# Patient Record
Sex: Female | Born: 2000 | Race: Black or African American | Hispanic: No | Marital: Single
Health system: Southern US, Community
[De-identification: ages and names within clinical notes are randomized; demographics above are authoritative.]

## PROBLEM LIST (undated history)

## (undated) HISTORY — PX: FOOT SURGERY: SHX648

---
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
## 2021-01-11 DIAGNOSIS — Z419 Encounter for procedure for purposes other than remedying health state, unspecified: Secondary | ICD-10-CM | POA: Diagnosis not present

## 2021-02-11 DIAGNOSIS — Z419 Encounter for procedure for purposes other than remedying health state, unspecified: Secondary | ICD-10-CM | POA: Diagnosis not present

## 2021-03-14 DIAGNOSIS — Z419 Encounter for procedure for purposes other than remedying health state, unspecified: Secondary | ICD-10-CM | POA: Diagnosis not present

## 2021-04-13 DIAGNOSIS — Z419 Encounter for procedure for purposes other than remedying health state, unspecified: Secondary | ICD-10-CM | POA: Diagnosis not present

## 2021-05-12 ENCOUNTER — Other Ambulatory Visit: Payer: Self-pay

## 2021-05-12 ENCOUNTER — Ambulatory Visit (HOSPITAL_COMMUNITY)
Admission: EM | Admit: 2021-05-12 | Discharge: 2021-05-12 | Discharge status: Against Medical Advice | Payer: Medicaid Other

## 2021-05-14 DIAGNOSIS — Z419 Encounter for procedure for purposes other than remedying health state, unspecified: Secondary | ICD-10-CM | POA: Diagnosis not present

## 2021-06-13 DIAGNOSIS — Z419 Encounter for procedure for purposes other than remedying health state, unspecified: Secondary | ICD-10-CM | POA: Diagnosis not present

## 2021-06-30 ENCOUNTER — Ambulatory Visit (HOSPITAL_COMMUNITY)
Admission: EM | Admit: 2021-06-30 | Discharge: 2021-06-30 | Disposition: A | Discharge status: Home / Self Care | Payer: Medicaid Other | Attending: Family Medicine | Admitting: Family Medicine

## 2021-06-30 ENCOUNTER — Other Ambulatory Visit: Payer: Self-pay

## 2021-06-30 ENCOUNTER — Encounter (HOSPITAL_COMMUNITY): Payer: Self-pay | Admitting: Emergency Medicine

## 2021-06-30 DIAGNOSIS — N61 Mastitis without abscess: Secondary | ICD-10-CM

## 2021-06-30 DIAGNOSIS — N6001 Solitary cyst of right breast: Secondary | ICD-10-CM | POA: Diagnosis not present

## 2021-06-30 MED ORDER — CEPHALEXIN 250 MG/5ML PO SUSR
250.0000 mg | Freq: Three times a day (TID) | ORAL | 0 refills | Status: AC
Start: 2021-06-30 — End: 2021-07-07

## 2021-06-30 MED ORDER — CEPHALEXIN 250 MG PO CAPS
250.0000 mg | ORAL_CAPSULE | Freq: Three times a day (TID) | ORAL | 0 refills | Status: DC
Start: 2021-06-30 — End: 2021-06-30

## 2021-06-30 NOTE — ED Provider Notes (Signed)
[facility]    CSN: [id number] Arrival date & time: 06/30/21  1633      History   Chief Complaint Chief Complaint  Patient presents with   Breast Mass    HPI Jeanne Harrison is a 20 y.o. female.   HPI Here for knot in right breast for about 3 months, and has slowly enlarged. Is painful now. No f/c/n/v/d.  LMP doesn't have periods regularly.  History reviewed. No pertinent past medical history.  There are no problems to display for this patient.   Past Surgical History:  Procedure Laterality Date   FOOT SURGERY      OB History   No obstetric history on file.      Home Medications    Prior to Admission medications   Medication Sig Start Date End Date Taking? Authorizing Provider  cephALEXin (KEFLEX) 250 MG capsule Take 1 capsule (250 mg total) by mouth 3 (three) times daily for 7 days. 06/30/21 07/07/21 Yes Zenia Resides, MD  ondansetron (ZOFRAN-ODT) 4 MG disintegrating tablet Take 1 tablet (4 mg total) by mouth every 8 (eight) hours as needed for nausea or vomiting. 09/23/20   Georgetta Haber, NP  terconazole (TERAZOL 3) 0.8 % vaginal cream Place 1 applicator vaginally at bedtime. 09/11/20   Wallis Bamberg, PA-C  valACYclovir (VALTREX) 1000 MG tablet At the start of an outbreak, take 1 tablet daily for 5 days. 09/11/20   Wallis Bamberg, PA-C    Family History Family History  Family history unknown: Yes    Social History Social History   Tobacco Use   Smoking status: Passive Smoke Exposure - Never Smoker   Smokeless tobacco: Never  Substance Use Topics   Alcohol use: No   Drug use: No     Allergies   Patient has no known allergies.   Review of Systems Review of Systems   Physical Exam Triage Vital Signs ED Triage Vitals [06/30/21 1735]  Enc Vitals Group     BP (!) 153/89     Pulse Rate 86     Resp 19     Temp 98.6 F (37 C)     Temp Source Oral     SpO2 98 %     Weight      Height      Head Circumference      Peak Flow       Pain Score      Pain Loc      Pain Edu?      Excl. in GC?    No data found.  Updated Vital Signs BP (!) 153/89 (BP Location: Left Arm)    Pulse 86    Temp 98.6 F (37 C) (Oral)    Resp 19    SpO2 98%   Visual Acuity Right Eye Distance:   Left Eye Distance:   Bilateral Distance:    Right Eye Near:   Left Eye Near:    Bilateral Near:     Physical Exam Constitutional:      General: She is not in acute distress. Cardiovascular:     Rate and Rhythm: Normal rate and regular rhythm.     Heart sounds: No murmur heard. Pulmonary:     Effort: Pulmonary effort is normal.     Breath sounds: Normal breath sounds.  Chest:       Comments: Right medial breast has a fluctuant swelling that is not well defined, about 3 cm x 2 cm, mild erythema, and is  tender some. Skin:    Coloration: Skin is not jaundiced or pale.  Neurological:     General: No focal deficit present.     Mental Status: She is alert and oriented to person, place, and time.  Psychiatric:        Behavior: Behavior normal.     UC Treatments / Results  Labs (all labs ordered are listed, but only abnormal results are displayed) Labs Reviewed - No data to display  EKG   Radiology No results found.  Procedures Procedures (including critical care time)  Medications Ordered in UC Medications - No data to display  Initial Impression / Assessment and Plan / UC Course  I have reviewed the triage vital signs and the nursing notes.  Pertinent labs & imaging results that were available during my care of the patient were reviewed by me and considered in my medical decision making (see chart for details).     Prob cyst, with poss mastitis/cellulitis now. Discussed may need imaging/draining if not improving. Instructions given on finding a pcp Final Clinical Impressions(s) / UC Diagnoses   Final diagnoses:  Benign breast cyst in female, right  Acute mastitis of right breast     Discharge Instructions       Warm compresses. Take cephalexin 3 times daily for 7 days.  Come back if worsening or not improving.     ED Prescriptions     Medication Sig Dispense Auth. Provider   cephALEXin (KEFLEX) 250 MG capsule Take 1 capsule (250 mg total) by mouth 3 (three) times daily for 7 days. 21 capsule Barrett Henle, MD      PDMP not reviewed this encounter.   Barrett Henle, MD 06/30/21 [id number]

## 2021-06-30 NOTE — ED Triage Notes (Signed)
Pt c/o knot on right breast that been ongoing for about 3 months. Reports larger in size.

## 2021-06-30 NOTE — Discharge Instructions (Addendum)
Warm compresses. Take cephalexin 3 times daily for 7 days.  Come back if worsening or not improving.

## 2021-07-14 DIAGNOSIS — Z419 Encounter for procedure for purposes other than remedying health state, unspecified: Secondary | ICD-10-CM | POA: Diagnosis not present

## 2021-08-14 DIAGNOSIS — Z419 Encounter for procedure for purposes other than remedying health state, unspecified: Secondary | ICD-10-CM | POA: Diagnosis not present

## 2021-09-05 ENCOUNTER — Emergency Department (HOSPITAL_COMMUNITY)
Admission: EM | Admit: 2021-09-05 | Discharge: 2021-09-05 | Disposition: A | Discharge status: Home / Self Care | Payer: Medicaid Other | Attending: Emergency Medicine | Admitting: Emergency Medicine

## 2021-09-05 ENCOUNTER — Emergency Department (HOSPITAL_COMMUNITY): Payer: Medicaid Other

## 2021-09-05 ENCOUNTER — Other Ambulatory Visit: Payer: Self-pay

## 2021-09-05 ENCOUNTER — Encounter (HOSPITAL_COMMUNITY): Payer: Self-pay | Admitting: Emergency Medicine

## 2021-09-05 DIAGNOSIS — Y92410 Unspecified street and highway as the place of occurrence of the external cause: Secondary | ICD-10-CM | POA: Diagnosis not present

## 2021-09-05 DIAGNOSIS — M25522 Pain in left elbow: Secondary | ICD-10-CM | POA: Insufficient documentation

## 2021-09-05 DIAGNOSIS — M79651 Pain in right thigh: Secondary | ICD-10-CM | POA: Insufficient documentation

## 2021-09-05 DIAGNOSIS — M79652 Pain in left thigh: Secondary | ICD-10-CM | POA: Diagnosis not present

## 2021-09-05 DIAGNOSIS — M25561 Pain in right knee: Secondary | ICD-10-CM | POA: Diagnosis not present

## 2021-09-05 DIAGNOSIS — M25562 Pain in left knee: Secondary | ICD-10-CM | POA: Diagnosis not present
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
## 2021-09-05 NOTE — ED Notes (Signed)
Pt verbalized understanding of d/c instructions, meds, and followup care. Denies questions. VSS, no distress noted. Steady gait to exit with all belongings.  ?

## 2021-09-11 DIAGNOSIS — Z419 Encounter for procedure for purposes other than remedying health state, unspecified: Secondary | ICD-10-CM | POA: Diagnosis not present

## 2021-10-12 DIAGNOSIS — Z419 Encounter for procedure for purposes other than remedying health state, unspecified: Secondary | ICD-10-CM | POA: Diagnosis not present

## 2021-11-11 DIAGNOSIS — Z419 Encounter for procedure for purposes other than remedying health state, unspecified: Secondary | ICD-10-CM | POA: Diagnosis not present

## 2021-12-12 DIAGNOSIS — Z419 Encounter for procedure for purposes other than remedying health state, unspecified: Secondary | ICD-10-CM | POA: Diagnosis not present

## 2021-12-13 ENCOUNTER — Emergency Department (HOSPITAL_COMMUNITY)
Admission: EM | Admit: 2021-12-13 | Discharge: 2021-12-13 | Disposition: A | Discharge status: Home / Self Care | Payer: Medicaid Other | Attending: Emergency Medicine | Admitting: Emergency Medicine

## 2021-12-13 ENCOUNTER — Encounter (HOSPITAL_COMMUNITY): Payer: Self-pay | Admitting: Emergency Medicine

## 2021-12-13 ENCOUNTER — Other Ambulatory Visit: Payer: Self-pay

## 2021-12-13 ENCOUNTER — Emergency Department (HOSPITAL_COMMUNITY): Payer: Medicaid Other

## 2021-12-13 DIAGNOSIS — R1011 Right upper quadrant pain: Secondary | ICD-10-CM | POA: Diagnosis not present

## 2021-12-13 DIAGNOSIS — R112 Nausea with vomiting, unspecified: Secondary | ICD-10-CM | POA: Insufficient documentation

## 2021-12-13 DIAGNOSIS — R197 Diarrhea, unspecified: Secondary | ICD-10-CM | POA: Diagnosis not present

## 2021-12-13 DIAGNOSIS — R1010 Upper abdominal pain, unspecified: Secondary | ICD-10-CM

## 2021-12-13 LAB — CBC
HCT: 43.1 % (ref 36.0–46.0)
Hemoglobin: 14 g/dL (ref 12.0–15.0)
MCH: 26.6 pg (ref 26.0–34.0)
MCHC: 32.5 g/dL (ref 30.0–36.0)
MCV: 81.8 fL (ref 80.0–100.0)
Platelets: 310 10*3/uL (ref 150–400)
RBC: 5.27 MIL/uL — ABNORMAL HIGH (ref 3.87–5.11)
RDW: 13.6 % (ref 11.5–15.5)
WBC: 8.7 10*3/uL (ref 4.0–10.5)
nRBC: 0 % (ref 0.0–0.2)

## 2021-12-13 LAB — COMPREHENSIVE METABOLIC PANEL
ALT: 21 U/L (ref 0–44)
AST: 25 U/L (ref 15–41)
Albumin: 3.8 g/dL (ref 3.5–5.0)
Alkaline Phosphatase: 82 U/L (ref 38–126)
Anion gap: 8 (ref 5–15)
BUN: 9 mg/dL (ref 6–20)
CO2: 26 mmol/L (ref 22–32)
Calcium: 9.2 mg/dL (ref 8.9–10.3)
Chloride: 106 mmol/L (ref 98–111)
Creatinine, Ser: 0.92 mg/dL (ref 0.44–1.00)
GFR, Estimated: >60 mL/min (ref >60)
Glucose, Bld: 93 mg/dL (ref 70–99)
Potassium: 4.4 mmol/L (ref 3.5–5.1)
Sodium: 140 mmol/L (ref 135–145)
Total Bilirubin: 0.7 mg/dL (ref 0.3–1.2)
Total Protein: 7.9 g/dL (ref 6.5–8.1)

## 2021-12-13 LAB — I-STAT BETA HCG BLOOD, ED (MC, WL, AP ONLY): I-stat hCG, quantitative: <5 m[IU]/mL (ref <5)

## 2021-12-13 LAB — LIPASE, BLOOD: Lipase: 35 U/L (ref 11–51)

## 2021-12-13 MED ORDER — OMEPRAZOLE 20 MG PO CPDR: 20.0000 mg | DELAYED_RELEASE_CAPSULE | Freq: Every day | ORAL | 0 refills | Status: DC

## 2021-12-13 MED ORDER — MORPHINE SULFATE (PF) 4 MG/ML IV SOLN
4.0000 mg | Freq: Once | INTRAVENOUS | Status: AC
  Administered 2021-12-13: 4 mg via INTRAVENOUS
  Filled 2021-12-13: qty 1

## 2021-12-13 MED ORDER — ONDANSETRON HCL 4 MG/2ML IJ SOLN
4.0000 mg | Freq: Once | INTRAMUSCULAR | Status: AC
  Administered 2021-12-13: 4 mg via INTRAVENOUS
  Filled 2021-12-13: qty 2

## 2021-12-13 NOTE — ED Notes (Signed)
US at bedside

## 2021-12-13 NOTE — Discharge Instructions (Signed)
You are seen today for abdominal pain.  Your work-up is generally reassuring.  Follow-up with gastroenterology if symptoms continue or worsen.

## 2021-12-13 NOTE — ED Provider Notes (Signed)
[facility]   CSN: [id number] Arrival date & time: 12/13/21  0249     History  Chief Complaint  Patient presents with   Abdominal Pain    Jeanne Harrison is a 21 y.o. female.  HPI     This is a 21 year old female with no reported past medical history who presents with abdominal pain.  Patient reports 2-week history of worsening sharp right upper quadrant and right epigastric abdominal pain.  It is worse with eating.  She reports nausea and vomiting as well as occasional diarrhea.  No known sick contacts.  Denies fevers.  Has not taken anything for her pain.  No past surgical history.  Home Medications Prior to Admission medications   Medication Sig Start Date End Date Taking? Authorizing Provider  omeprazole (PRILOSEC) 20 MG capsule Take 1 capsule (20 mg total) by mouth daily. 12/13/21  Yes Kymoni Lesperance, Mayer Maskerourtney F, MD  ondansetron (ZOFRAN-ODT) 4 MG disintegrating tablet Take 1 tablet (4 mg total) by mouth every 8 (eight) hours as needed for nausea or vomiting. 09/23/20   Georgetta HaberBurky, Natalie B, NP  terconazole (TERAZOL 3) 0.8 % vaginal cream Place 1 applicator vaginally at bedtime. 09/11/20   Wallis BambergMani, Mario, PA-C  valACYclovir (VALTREX) 1000 MG tablet At the start of an outbreak, take 1 tablet daily for 5 days. 09/11/20   Wallis BambergMani, Mario, PA-C      Allergies    Patient has no known allergies.    Review of Systems   Review of Systems  Constitutional:  Negative for fever.  Gastrointestinal:  Positive for abdominal pain, nausea and vomiting.  All other systems reviewed and are negative.  Physical Exam Updated Vital Signs BP (!) 146/101   Pulse 94   Temp 98.7 F (37.1 C) (Oral)   Resp 16   SpO2 100%  Physical Exam Vitals and nursing note reviewed.  Constitutional:      Appearance: She is well-developed. She is obese. She is not ill-appearing.  HENT:     Head: Normocephalic and atraumatic.  Eyes:     Pupils: Pupils are equal, round, and  reactive to light.  Cardiovascular:     Rate and Rhythm: Normal rate and regular rhythm.     Heart sounds: Normal heart sounds.  Pulmonary:     Effort: Pulmonary effort is normal. No respiratory distress.     Breath sounds: No wheezing.  Abdominal:     General: Bowel sounds are normal.     Palpations: Abdomen is soft.     Tenderness: There is abdominal tenderness in the right upper quadrant. There is no guarding or rebound.  Musculoskeletal:     Cervical back: Neck supple.  Skin:    General: Skin is warm and dry.  Neurological:     Mental Status: She is alert and oriented to person, place, and time.  Psychiatric:        Mood and Affect: Mood normal.    ED Results / Procedures / Treatments   Labs (all labs ordered are listed, but only abnormal results are displayed) Labs Reviewed  CBC - Abnormal; Notable for the following components:      Result Value   RBC 5.27 (*)    All other components within normal limits  LIPASE, BLOOD  COMPREHENSIVE METABOLIC PANEL  URINALYSIS, ROUTINE W REFLEX MICROSCOPIC  I-STAT BETA HCG BLOOD, ED ([facility], [facility], [facility] ONLY)    EKG None  Radiology US Abdomen Limited RUQ (LIVER/GB)  Result Date: 12/13/2021  CLINICAL DATA:  Right upper quadrant pain. EXAM: ULTRASOUND ABDOMEN LIMITED RIGHT UPPER QUADRANT COMPARISON:  None Available. FINDINGS: Gallbladder: No gallstones or wall thickening visualized. No sonographic Murphy sign noted by sonographer. Common bile duct: Diameter: 3.6 mm with no visible intrahepatic biliary prominence. Liver: No focal lesion identified. The liver is diffusely attenuating and echogenic consistent with fatty replacement. Portal vein is patent on color Doppler imaging with normal direction of blood flow towards the liver. Other: None. IMPRESSION: 1. No evidence of cholelithiasis or acute cholecystitis. 2. Diffusely echogenic liver consistent with steatosis. Electronically Signed   By: Almira Bar M.D.   On: 12/13/2021 06:21     Procedures Procedures    Medications Ordered in ED Medications  morphine (PF) 4 MG/ML injection 4 mg (4 mg Intravenous Given 12/13/21 0542)  ondansetron (ZOFRAN) injection 4 mg (4 mg Intravenous Given 12/13/21 0542)    ED Course/ Medical Decision Making/ A&P                           Medical Decision Making Amount and/or Complexity of Data Reviewed Labs: ordered. Radiology: ordered.  Risk Prescription drug management.   This patient presents to the ED for concern of abdominal pain, this involves an extensive number of treatment options, and is a complaint that carries with it a high risk of complications and morbidity.  I considered the following differential and admission for this acute, potentially life threatening condition.  The differential diagnosis includes cholecystitis, pancreatitis, gastritis, gastroenteritis, less likely appendicitis  MDM:     This is a 21 year old female who presents with abdominal pain.  Reports 2-week history of intermittent postprandial pain.  She is nontoxic and vital signs are reassuring.  Labs reviewed.  No significant leukocytosis.  No LFT derangements or lipase derangements.  Right upper quadrant ultrasound imaging does not show any evidence of gallstones or gallbladder inflammation.  On recheck, patient states she is comfortable.  Given her age, could be related to reflux.  Will start on omeprazole.  (Labs, imaging, consults)  Labs: I Ordered, and personally interpreted labs.  The pertinent results include: CBC, CMP, lipase, beta-hCG  Imaging Studies ordered: I ordered imaging studies including a right upper quadrant ultrasound I independently visualized and interpreted imaging. I agree with the radiologist interpretation  Additional history obtained from chart review.  External records from outside source obtained and reviewed including prior visits  Cardiac Monitoring: The patient was maintained on a cardiac monitor.  I personally  viewed and interpreted the cardiac monitored which showed an underlying rhythm of: Normal sinus rhythm  Reevaluation: After the interventions noted above, I reevaluated the patient and found that they have :improved  Social Determinants of Health: Lives independently  Disposition: Discharge  Co morbidities that complicate the patient evaluation History reviewed. No pertinent past medical history.   Medicines Meds ordered this encounter  Medications   morphine (PF) 4 MG/ML injection 4 mg   ondansetron (ZOFRAN) injection 4 mg   omeprazole (PRILOSEC) 20 MG capsule    Sig: Take 1 capsule (20 mg total) by mouth daily.    Dispense:  30 capsule    Refill:  0    I have reviewed the patients home medicines and have made adjustments as needed  Problem List / ED Course: Problem List Items Addressed This Visit   None Visit Diagnoses     Pain of upper abdomen    -  Primary  Final Clinical Impression(s) / ED Diagnoses Final diagnoses:  Pain of upper abdomen    Rx / DC Orders ED Discharge Orders          Ordered    omeprazole (PRILOSEC) 20 MG capsule  Daily        12/13/21 0635              Shon Baton, MD 12/13/21 [phone]

## 2021-12-13 NOTE — ED Notes (Signed)
Patient verbalizes understanding of d/c instructions. Opportunities for questions and answers were provided. Pt d/c from ED and ambulated to lobby where family is picking pt up.  

## 2021-12-13 NOTE — ED Triage Notes (Signed)
Pt c/o generalized abdominal pain with nausea/vomiting "for a while". Reports symptoms do not occur everyday. Also c/o left ear pain.

## 2022-03-14 DIAGNOSIS — Z419 Encounter for procedure for purposes other than remedying health state, unspecified: Secondary | ICD-10-CM | POA: Diagnosis not present

## 2022-04-13 DIAGNOSIS — Z419 Encounter for procedure for purposes other than remedying health state, unspecified: Secondary | ICD-10-CM | POA: Diagnosis not present

## 2022-04-19 ENCOUNTER — Encounter (HOSPITAL_COMMUNITY): Payer: Self-pay | Admitting: Emergency Medicine

## 2022-04-19 ENCOUNTER — Emergency Department (HOSPITAL_COMMUNITY): Payer: Medicaid Other

## 2022-04-19 ENCOUNTER — Emergency Department (HOSPITAL_COMMUNITY)
Admission: EM | Admit: 2022-04-19 | Discharge: 2022-04-19 | Disposition: A | Discharge status: Home / Self Care | Payer: Medicaid Other | Attending: Emergency Medicine | Admitting: Emergency Medicine

## 2022-04-19 ENCOUNTER — Other Ambulatory Visit: Payer: Self-pay

## 2022-04-19 DIAGNOSIS — J40 Bronchitis, not specified as acute or chronic: Secondary | ICD-10-CM | POA: Diagnosis not present

## 2022-04-19 DIAGNOSIS — L0100 Impetigo, unspecified: Secondary | ICD-10-CM | POA: Insufficient documentation

## 2022-04-19 DIAGNOSIS — R079 Chest pain, unspecified: Secondary | ICD-10-CM | POA: Diagnosis not present

## 2022-04-19 DIAGNOSIS — R0789 Other chest pain: Secondary | ICD-10-CM

## 2022-04-19 DIAGNOSIS — R0602 Shortness of breath: Secondary | ICD-10-CM | POA: Diagnosis not present

## 2022-04-19 LAB — BASIC METABOLIC PANEL
Anion gap: 7 (ref 5–15)
BUN: 5 mg/dL — ABNORMAL LOW (ref 6–20)
CO2: 24 mmol/L (ref 22–32)
Calcium: 8.8 mg/dL — ABNORMAL LOW (ref 8.9–10.3)
Chloride: 106 mmol/L (ref 98–111)
Creatinine, Ser: 0.94 mg/dL (ref 0.44–1.00)
GFR, Estimated: >60 mL/min (ref >60)
Glucose, Bld: 90 mg/dL (ref 70–99)
Potassium: 4.5 mmol/L (ref 3.5–5.1)
Sodium: 137 mmol/L (ref 135–145)

## 2022-04-19 LAB — CBC
HCT: 42.8 % (ref 36.0–46.0)
Hemoglobin: 14 g/dL (ref 12.0–15.0)
MCH: 26.8 pg (ref 26.0–34.0)
MCHC: 32.7 g/dL (ref 30.0–36.0)
MCV: 82 fL (ref 80.0–100.0)
Platelets: 276 10*3/uL (ref 150–400)
RBC: 5.22 MIL/uL — ABNORMAL HIGH (ref 3.87–5.11)
RDW: 14.2 % (ref 11.5–15.5)
WBC: 7.4 10*3/uL (ref 4.0–10.5)
nRBC: 0 % (ref 0.0–0.2)

## 2022-04-19 LAB — I-STAT BETA HCG BLOOD, ED (MC, WL, AP ONLY): I-stat hCG, quantitative: <5 m[IU]/mL (ref <5)

## 2022-04-19 LAB — TROPONIN I (HIGH SENSITIVITY): Troponin I (High Sensitivity): <2 ng/L (ref <18)

## 2022-04-19 MED ORDER — ALBUTEROL SULFATE HFA 108 (90 BASE) MCG/ACT IN AERS: 2.0000 | INHALATION_SPRAY | RESPIRATORY_TRACT | 2 refills | Status: DC | PRN

## 2022-04-19 MED ORDER — PREDNISONE 20 MG PO TABS
60.0000 mg | ORAL_TABLET | Freq: Once | ORAL | Status: AC
  Administered 2022-04-19: 60 mg via ORAL
  Filled 2022-04-19: qty 3

## 2022-04-19 MED ORDER — DOXYCYCLINE HYCLATE 100 MG PO CAPS: 100.0000 mg | ORAL_CAPSULE | Freq: Two times a day (BID) | ORAL | 0 refills | Status: DC

## 2022-04-19 MED ORDER — ALBUTEROL SULFATE HFA 108 (90 BASE) MCG/ACT IN AERS
2.0000 | INHALATION_SPRAY | RESPIRATORY_TRACT | Status: DC | PRN
  Administered 2022-04-19: 2 via RESPIRATORY_TRACT
  Filled 2022-04-19: qty 6.7

## 2022-04-19 MED ORDER — FLUCONAZOLE 200 MG PO TABS: 200.0000 mg | ORAL_TABLET | Freq: Every day | ORAL | 0 refills | Status: DC
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
## 2022-04-19 NOTE — ED Provider Notes (Signed)
[facility] EMERGENCY DEPARTMENT Provider Note   CSN: [id number] Arrival date & time: 04/19/22  0019     History  Chief Complaint  Patient presents with   Chest Pain   Shortness of Breath    Jeanne Harrison is a 21 y.o. female.  Patient presents to the emergency department with multiple complaints.  Patient reports that she has been having intermittent episodes of sharp stabbing pain in the right upper chest for 3 weeks.  Patient reports that there is mold in her mother's home and it has been "getting everyone sick".  Patient reports that she has had a persistent cough over this period of time and coughing makes the pain worse.  There is also pain with movement of the chest wall.  Denies direct injury.  Patient does report some shortness of breath.  Patient also concerned about a rash on her face.  She reports that she thought it started as pimples but it is spreading.       Home Medications Prior to Admission medications   Medication Sig Start Date End Date Taking? Authorizing Provider  albuterol (VENTOLIN HFA) 108 (90 Base) MCG/ACT inhaler Inhale 2 puffs into the lungs every 4 (four) hours as needed for wheezing or shortness of breath. 04/19/22  Yes Jeffrey Graefe, Gwenyth Allegra, MD  doxycycline (VIBRAMYCIN) 100 MG capsule Take 1 capsule (100 mg total) by mouth 2 (two) times daily. 04/19/22  Yes Niaya Hickok, Gwenyth Allegra, MD  fluconazole (DIFLUCAN) 200 MG tablet Take 1 tablet (200 mg total) by mouth daily. 04/19/22  Yes Camar Guyton, Gwenyth Allegra, MD  omeprazole (PRILOSEC) 20 MG capsule Take 1 capsule (20 mg total) by mouth daily. 12/13/21   Horton, Barbette Hair, MD  ondansetron (ZOFRAN-ODT) 4 MG disintegrating tablet Take 1 tablet (4 mg total) by mouth every 8 (eight) hours as needed for nausea or vomiting. 09/23/20   Zigmund Gottron, NP  terconazole (TERAZOL 3) 0.8 % vaginal cream Place 1 applicator vaginally at bedtime. 09/11/20   Jaynee Eagles, PA-C  valACYclovir (VALTREX) 1000 MG  tablet At the start of an outbreak, take 1 tablet daily for 5 days. 09/11/20   Jaynee Eagles, PA-C      Allergies    Patient has no known allergies.    Review of Systems   Review of Systems  Physical Exam Updated Vital Signs BP (!) 129/92 (BP Location: Left Arm)   Pulse 83   Temp 98.6 F (37 C) (Oral)   Resp 14   SpO2 100%  Physical Exam Vitals and nursing note reviewed.  Constitutional:      General: She is not in acute distress.    Appearance: She is well-developed.  HENT:     Head: Normocephalic and atraumatic.     Mouth/Throat:     Mouth: Mucous membranes are moist.  Eyes:     General: Vision grossly intact. Gaze aligned appropriately.     Extraocular Movements: Extraocular movements intact.     Conjunctiva/sclera: Conjunctivae normal.  Cardiovascular:     Rate and Rhythm: Normal rate and regular rhythm.     Pulses: Normal pulses.     Heart sounds: Normal heart sounds, S1 normal and S2 normal. No murmur heard.    No friction rub. No gallop.  Pulmonary:     Effort: Pulmonary effort is normal. No respiratory distress.     Breath sounds: Normal breath sounds.  Chest:     Chest wall: Tenderness present.       Comments: Point  tenderness right upper chest, reproduces her pain Abdominal:     General: Bowel sounds are normal.     Palpations: Abdomen is soft.     Tenderness: There is no abdominal tenderness. There is no guarding or rebound.     Hernia: No hernia is present.  Musculoskeletal:        General: No swelling.     Cervical back: Full passive range of motion without pain, normal range of motion and neck supple. No spinous process tenderness or muscular tenderness. Normal range of motion.     Right lower leg: No edema.     Left lower leg: No edema.  Skin:    General: Skin is warm and dry.     Capillary Refill: Capillary refill takes less than 2 seconds.     Findings: Rash (Excoriated, circular slightly ulcerated lesions of various diameter with yellow crusting  chin and neck) present. No ecchymosis, erythema or wound.  Neurological:     General: No focal deficit present.     Mental Status: She is alert and oriented to person, place, and time.     GCS: GCS eye subscore is 4. GCS verbal subscore is 5. GCS motor subscore is 6.     Cranial Nerves: Cranial nerves 2-12 are intact.     Sensory: Sensation is intact.     Motor: Motor function is intact.     Coordination: Coordination is intact.  Psychiatric:        Attention and Perception: Attention normal.        Mood and Affect: Mood normal.        Speech: Speech normal.        Behavior: Behavior normal.     ED Results / Procedures / Treatments   Labs (all labs ordered are listed, but only abnormal results are displayed) Labs Reviewed  BASIC METABOLIC PANEL - Abnormal; Notable for the following components:      Result Value   BUN 5 (*)    Calcium 8.8 (*)    All other components within normal limits  CBC - Abnormal; Notable for the following components:   RBC 5.22 (*)    All other components within normal limits  I-STAT BETA HCG BLOOD, ED ([facility], [facility], [facility] ONLY)  TROPONIN I (HIGH SENSITIVITY)  TROPONIN I (HIGH SENSITIVITY)    EKG EKG Interpretation  Date/Time:  Saturday April 19 2022 01:05:09 EDT Ventricular Rate:  91 PR Interval:  166 QRS Duration: 76 QT Interval:  332 QTC Calculation: 408 R Axis:   65 Text Interpretation: Normal sinus rhythm Normal ECG No previous ECGs available Confirmed by Gilda Crease [phone]) on 04/19/2022 3:37:10 AM  Radiology DG Chest 2 View  Result Date: 04/19/2022 CLINICAL DATA:  Chest pain and shortness of breath. EXAM: CHEST - 2 VIEW COMPARISON:  June 13, 2011 FINDINGS: The heart size and mediastinal contours are within normal limits. Both lungs are clear. The visualized skeletal structures are unremarkable. IMPRESSION: No active cardiopulmonary disease. Electronically Signed   By: Aram Candela M.D.   On: 04/19/2022 01:57     Procedures Procedures    Medications Ordered in ED Medications  albuterol (VENTOLIN HFA) 108 (90 Base) MCG/ACT inhaler 2 puff (has no administration in time range)  predniSONE (DELTASONE) tablet 60 mg (has no administration in time range)    ED Course/ Medical Decision Making/ A&P  Medical Decision Making Amount and/or Complexity of Data Reviewed Labs: ordered. Radiology: ordered.  Risk Prescription drug management.   Patient presents with complaints of right-sided chest pain.  Pain is reproducible by palpation.  She has had a cough for several weeks.  Chest x-ray does not show evidence of pneumonia.  No other pathology noted.  Cardiac evaluation unremarkable.  Normal EKG. patient PERC negative.  With symptoms ongoing for 3 weeks, associated with cough and reproducible pain, no further work-up necessary to rule out PE.  Patient does have a facial rash.  There are some impetiginous components, but there is also circular lesions with borders that are slightly raised, could be ringworm as well.  We will double cover.        Final Clinical Impression(s) / ED Diagnoses Final diagnoses:  Bronchitis  Chest wall pain  Impetigo    Rx / DC Orders ED Discharge Orders          Ordered    doxycycline (VIBRAMYCIN) 100 MG capsule  2 times daily        04/19/22 0352    fluconazole (DIFLUCAN) 200 MG tablet  Daily        04/19/22 0352    albuterol (VENTOLIN HFA) 108 (90 Base) MCG/ACT inhaler  Every 4 hours PRN        04/19/22 0352              Gilda Crease, MD 04/19/22 [phone]

## 2022-04-28 DIAGNOSIS — H5213 Myopia, bilateral: Secondary | ICD-10-CM | POA: Diagnosis not present

## 2022-05-14 DIAGNOSIS — Z419 Encounter for procedure for purposes other than remedying health state, unspecified: Secondary | ICD-10-CM | POA: Diagnosis not present

## 2022-06-13 DIAGNOSIS — Z419 Encounter for procedure for purposes other than remedying health state, unspecified: Secondary | ICD-10-CM | POA: Diagnosis not present

## 2022-07-14 DIAGNOSIS — Z419 Encounter for procedure for purposes other than remedying health state, unspecified: Secondary | ICD-10-CM | POA: Diagnosis not present

## 2022-07-22 ENCOUNTER — Other Ambulatory Visit: Payer: Self-pay

## 2022-07-22 ENCOUNTER — Other Ambulatory Visit (HOSPITAL_COMMUNITY)
Admission: RE | Admit: 2022-07-22 | Discharge: 2022-07-22 | Disposition: A | Discharge status: Home / Self Care | Payer: Medicaid Other | Source: Ambulatory Visit | Attending: Family Medicine | Admitting: Family Medicine

## 2022-07-22 ENCOUNTER — Encounter: Payer: Self-pay | Admitting: Family Medicine

## 2022-07-22 ENCOUNTER — Ambulatory Visit: Payer: Medicaid Other | Attending: Family Medicine | Admitting: Family Medicine

## 2022-07-22 ENCOUNTER — Other Ambulatory Visit: Payer: Self-pay | Admitting: Family Medicine

## 2022-07-22 VITALS — BP 122/88 | HR 117 | Temp 99.0°F | Resp 16 | Ht 63.5 in | Wt 273.0 lb

## 2022-07-22 DIAGNOSIS — R519 Headache, unspecified: Secondary | ICD-10-CM

## 2022-07-22 DIAGNOSIS — Z23 Encounter for immunization: Secondary | ICD-10-CM

## 2022-07-22 DIAGNOSIS — N898 Other specified noninflammatory disorders of vagina: Secondary | ICD-10-CM | POA: Insufficient documentation

## 2022-07-22 DIAGNOSIS — Z1159 Encounter for screening for other viral diseases: Secondary | ICD-10-CM | POA: Diagnosis not present

## 2022-07-22 DIAGNOSIS — Z8619 Personal history of other infectious and parasitic diseases: Secondary | ICD-10-CM | POA: Diagnosis not present

## 2022-07-22 DIAGNOSIS — Z113 Encounter for screening for infections with a predominantly sexual mode of transmission: Secondary | ICD-10-CM

## 2022-07-22 MED ORDER — NAPROXEN 500 MG PO TABS: 500.0000 mg | ORAL_TABLET | Freq: Two times a day (BID) | ORAL | 1 refills | Status: DC

## 2022-07-22 NOTE — Patient Instructions (Signed)
Genital Herpes Genital herpes is a common sexually transmitted infection (STI) that is caused by a virus. The virus spreads from person to person through contact with a sore, infected saliva, or infected skin. The virus can cause itching, blisters, and sores around the genitals or rectum. During an outbreak of infection, symptoms may last for several days and then go away. However, the virus remains in the body, so more outbreaks may happen in the future. The time between outbreaks varies and can be from months to years. Genital herpes can affect anyone. It is particularly concerning for pregnant women because the virus can be passed to the baby during delivery. Genital herpes is also a concern for people who have a weak disease-fighting system (immune system). What are the causes? This condition is caused by the herpes simplex virus, type 1 or type 2 (HSV-1 or HSV-2). The virus may spread through: Sexual contact with an infected person, including vaginal, anal, and oral sex. Contact with a herpes sore. The skin. This means that you can get herpes from an infected partner even if there are no blisters or sores present. Your partner may not know that he or she is infected. What increases the risk? You are more likely to develop this condition if: You have sex with many partners. You do not use latex or polyurethane condoms during sex. What are the signs or symptoms? Most people do not have symptoms or they have mild symptoms that may be mistaken for other skin problems. Symptoms may include: Small, red bumps near the genitals, rectum, or mouth. These bumps turn into blisters and then sores. Flu-like (influenza-like) symptoms, including: Fever. Body aches. Swollen lymph nodes. Headache. Painful urination. Pain and itching in the genital area or rectal area. Vaginal discharge. Tingling or shooting pain in the legs and buttocks. Generally, symptoms are more severe and last longer during the first  (primary) outbreak. Influenza-like symptoms are also more common during the primary outbreak. How is this diagnosed? This condition may be diagnosed based on: A physical exam. Your medical history. Blood tests. A test of a fluid sample (culture) from an open sore. How is this treated? There is no cure for this condition, but treatment with antiviral medicines can do the following: Speed up healing and relieve symptoms. Help to reduce the spread of the virus to sexual partners. Limit the chance of future outbreaks, or make future outbreaks shorter. Lessen symptoms of future outbreaks. Your health care provider may also recommend over-the-counter medicines to help with pain and itching. Follow these instructions at home: If you have an outbreak:  Keep the affected areas dry and clean. Avoid rubbing or touching blisters and sores. If you do touch blisters or sores: Wash your hands thoroughly with soap and water for at least 20 seconds. If soap and water are not available, use an alcohol-based hand sanitizer. Do not touch your eyes afterward. Sexual activity Do not have sexual contact during active outbreaks. Practice safe sex. Herpes can spread even if your partner does not have blisters or sores. Latex or polyurethane condoms and female condoms may help prevent the spread of the herpes virus. Managing pain and discomfort If directed, put ice on the painful area. To do this: Put ice in a plastic bag. Place a towel between your skin and the bag. Leave the ice on for 20 minutes, 2-3 times a day. Remove the ice if your skin turns bright red. This is very important. If you cannot feel pain, heat, or   cold, you have a greater risk of damage to the area. If told, take a cool sitz bath to help relieve pain or itching. A sitz bath is a water bath that you take while sitting down in water that is deep enough to cover your hips and buttocks. General instructions Take over-the-counter and  prescription medicines only as told by your health care provider. If you were prescribed an antiviral medicine, use it as told by your health care provider. Do not stop using the antiviral even if you start to feel better. Keep all follow-up visits. This is important. How is this prevented? Use condoms. Although you can get genital herpes during sexual contact even with the use of a condom, a condom can provide some protection. Avoid having multiple sexual partners. Talk with your sexual partner about any symptoms either of you may have. Also, talk with your partner about any history of STIs. Do not have sexual contact if you have active symptoms of genital herpes. Contact a health care provider if: Your symptoms are not improving with medicine. Your symptoms return, or you have new symptoms. You have a fever. You have abdominal pain. You have redness, swelling, or pain in your eye. You notice new sores on other parts of your body. You have had herpes and you become pregnant or plan to become pregnant. Get help right away if: You have symptoms of viral meningitis. This is rare but may happen if the virus spreads to the brain. Symptoms may include: Severe headache or stiff neck. Muscle aches. Nausea and vomiting. Sensitivity to light. Summary Genital herpes is a common sexually transmitted infection (STI) that is caused by the herpes simplex virus, type 1 or type 2 (HSV-1 or HSV-2). These viruses are most often spread through sexual contact with an infected person. You are more likely to develop this condition if you have sex with many partners or you do not use condoms during sex. Most people do not have symptoms or have mild symptoms that may be mistaken for other skin problems. Symptoms occur as outbreaks that may happen months or years apart. There is no cure for this condition, but treatment with oral antiviral medicines can reduce symptoms, reduce the chance of spreading the virus to  a partner, prevent future outbreaks, or shorten future outbreaks. This information is not intended to replace advice given to you by your health care provider. Make sure you discuss any questions you have with your health care provider. Document Revised: 04/04/2021 Document Reviewed: 04/04/2021 Elsevier Patient Education  2023 [employer]  

## 2022-07-22 NOTE — Telephone Encounter (Signed)
Requested medication (s) are due for refill today: routing for review  Requested medication (s) are on the active medication list: yes  Last refill:  09/11/20 and 07/22/22  Future visit scheduled: yes  Notes to clinic:  Unable to refill per protocol, last refill by another provider, ( Valtrex). Naproxen is duplicate     Requested Prescriptions  Pending Prescriptions Disp Refills   naproxen (NAPROSYN) 500 MG tablet 60 tablet 1    Sig: Take 1 tablet (500 mg total) by mouth 2 (two) times daily with a meal.     Analgesics:  NSAIDS Failed - 07/22/2022  3:52 PM      Failed - Manual Review: Labs are only required if the patient has taken medication for more than 8 weeks.      Passed - Cr in normal range and within 360 days    Creatinine, Ser  Date Value Ref Range Status  04/19/2022 0.94 0.44 - 1.00 mg/dL Final         Passed - HGB in normal range and within 360 days    Hemoglobin  Date Value Ref Range Status  04/19/2022 14.0 12.0 - 15.0 g/dL Final         Passed - PLT in normal range and within 360 days    Platelets  Date Value Ref Range Status  04/19/2022 276 150 - 400 K/uL Final         Passed - HCT in normal range and within 360 days    HCT  Date Value Ref Range Status  04/19/2022 42.8 36.0 - 46.0 % Final         Passed - eGFR is 30 or above and within 360 days    GFR, Estimated  Date Value Ref Range Status  04/19/2022 >60 >60 mL/min Final    Comment:    (NOTE) Calculated using the CKD-EPI Creatinine Equation (2021)          Passed - Patient is not pregnant      Passed - Valid encounter within last 12 months    Recent Outpatient Visits           Today History of herpes genitalis   [place], Charlane Ferretti, MD       Future Appointments             In 3 months Charlott Rakes, MD [place]             valACYclovir (VALTREX) 1000 MG tablet 30 tablet 5    Sig: At the start of an outbreak,  take 1 tablet daily for 5 days.     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

## 2022-07-22 NOTE — Progress Notes (Signed)
STD screening Concerns of having HSV  Concerns with headache/ migraine

## 2022-07-22 NOTE — Progress Notes (Signed)
Subjective:  Patient ID: Jeanne Harrison, female    DOB: [birth year]  Age: 22 y.o. MRN: [id number]  CC: Establish Care and Headache   HPI Jeanne Harrison is a 22 y.o. year old female with a history of obesity who presents to establish care.  Interval History:  She Complains of frontal headaches and also on the center of head with associated blurry vision, photophoba, phonophobia and she has to sit in a dark room for relief. She had no nausea or vomiting It occurred for 2 weeks straight then resolved.  She has not had similar symptoms previously but describes this as a migraine.  She states she did not have to take any pain medications for relief.  She has noticed a vaginal odor, vaginal discharge is unsure if this is typical of her regular discharge or if it is different.  She has no vaginal itching. She would like to be screened for STDs out of precaution but states she has had no change in her sexual activity.  Review of her chart reveals a diagnosis of genital herpes in 2021.  She denies having any outbreaks and never took the Valtrex which was prescribed for her. History reviewed. No pertinent past medical history.  Past Surgical History:  Procedure Laterality Date   FOOT SURGERY      Family History  Problem Relation Age of Onset   Hypertension Mother    Hypertension Father    Diabetes Father    Kidney failure Father     Social History   Socioeconomic History   Marital status: Single    Spouse name: Not on file   Number of children: 0   Years of education: Not on file   Highest education level: Not on file  Occupational History   Not on file  Tobacco Use   Smoking status: Passive Smoke Exposure - Never Smoker   Smokeless tobacco: Never  Vaping Use   Vaping Use: Never used  Substance and Sexual Activity   Alcohol use: No   Drug use: Yes    Frequency: 2.0 times per week    Types: Marijuana   Sexual activity: Yes  Other Topics Concern   Not on file  Social  History Narrative   Not on file   Social Determinants of Health   Financial Resource Strain: Not on file  Food Insecurity: Not on file  Transportation Needs: Not on file  Physical Activity: Not on file  Stress: Not on file  Social Connections: Not on file    No Known Allergies  Outpatient Medications Prior to Visit  Medication Sig Dispense Refill   valACYclovir (VALTREX) 1000 MG tablet At the start of an outbreak, take 1 tablet daily for 5 days. (Patient not taking: Reported on 07/22/2022) 30 tablet 5   albuterol (VENTOLIN HFA) 108 (90 Base) MCG/ACT inhaler Inhale 2 puffs into the lungs every 4 (four) hours as needed for wheezing or shortness of breath. (Patient not taking: Reported on 07/22/2022) 1 each 2   doxycycline (VIBRAMYCIN) 100 MG capsule Take 1 capsule (100 mg total) by mouth 2 (two) times daily. (Patient not taking: Reported on 07/22/2022) 20 capsule 0   fluconazole (DIFLUCAN) 200 MG tablet Take 1 tablet (200 mg total) by mouth daily. (Patient not taking: Reported on 07/22/2022) 7 tablet 0   omeprazole (PRILOSEC) 20 MG capsule Take 1 capsule (20 mg total) by mouth daily. (Patient not taking: Reported on 07/22/2022) 30 capsule 0   ondansetron (ZOFRAN-ODT) 4 MG disintegrating  tablet Take 1 tablet (4 mg total) by mouth every 8 (eight) hours as needed for nausea or vomiting. (Patient not taking: Reported on 07/22/2022) 12 tablet 0   terconazole (TERAZOL 3) 0.8 % vaginal cream Place 1 applicator vaginally at bedtime. (Patient not taking: Reported on 07/22/2022) 20 g 0   No facility-administered medications prior to visit.     ROS Review of Systems  Constitutional:  Negative for activity change and appetite change.  HENT:  Negative for sinus pressure and sore throat.   Respiratory:  Negative for chest tightness, shortness of breath and wheezing.   Cardiovascular:  Negative for chest pain and palpitations.  Gastrointestinal:  Negative for abdominal distention, abdominal pain and  constipation.  Genitourinary:  Positive for vaginal discharge.  Musculoskeletal: Negative.   Psychiatric/Behavioral:  Negative for behavioral problems and dysphoric mood.     Objective:  BP 122/88   Pulse (!) 117   Temp 99 F (37.2 C) (Oral)   Resp 16   Ht 5' 3.5" (1.613 m)   Wt 273 lb (123.8 kg)   LMP 07/01/2022 (Approximate) Comment: irregular menses  SpO2 96%   BMI 47.60 kg/m      07/22/2022    2:01 PM 04/19/2022    3:50 AM 04/19/2022    1:00 AM  BP/Weight  Systolic BP 734 193 790  Diastolic BP 88 92 96  Wt. (Lbs) 273    BMI 47.6 kg/m2        Physical Exam Constitutional:      Appearance: She is well-developed.  Cardiovascular:     Rate and Rhythm: Normal rate.     Heart sounds: Normal heart sounds. No murmur heard. Pulmonary:     Effort: Pulmonary effort is normal.     Breath sounds: Normal breath sounds. No wheezing or rales.  Chest:     Chest wall: No tenderness.  Abdominal:     General: Bowel sounds are normal. There is no distension.     Palpations: Abdomen is soft. There is no mass.     Tenderness: There is no abdominal tenderness.  Musculoskeletal:        General: Normal range of motion.     Right lower leg: No edema.     Left lower leg: No edema.  Neurological:     Mental Status: She is alert and oriented to person, place, and time.  Psychiatric:        Mood and Affect: Mood normal.        Latest Ref Rng & Units 04/19/2022    1:13 AM 12/13/2021    3:04 AM 05/29/2020    3:30 PM  CMP  Glucose 70 - 99 mg/dL 90  93  87   BUN 6 - 20 mg/dL 5  9  9    Creatinine 0.44 - 1.00 mg/dL 0.94  0.92  0.77   Sodium 135 - 145 mmol/L 137  140  139   Potassium 3.5 - 5.1 mmol/L 4.5  4.4  4.5   Chloride 98 - 111 mmol/L 106  106  106   CO2 22 - 32 mmol/L 24  26  24    Calcium 8.9 - 10.3 mg/dL 8.8  9.2  8.8   Total Protein 6.5 - 8.1 g/dL  7.9  8.0   Total Bilirubin 0.3 - 1.2 mg/dL  0.7  0.9   Alkaline Phos 38 - 126 U/L  82  108   AST 15 - 41 U/L  25  22   ALT 0  -  44 U/L  21  30     Lipid Panel  No results found for: "CHOL", "TRIG", "HDL", "CHOLHDL", "VLDL", "LDLCALC", "LDLDIRECT"  CBC    Component Value Date/Time   WBC 7.4 04/19/2022 0113   RBC 5.22 (H) 04/19/2022 0113   HGB 14.0 04/19/2022 0113   HCT 42.8 04/19/2022 0113   PLT 276 04/19/2022 0113   MCV 82.0 04/19/2022 0113   MCH 26.8 04/19/2022 0113   MCHC 32.7 04/19/2022 0113   RDW 14.2 04/19/2022 0113   LYMPHSABS 2.8 05/29/2020 1530   MONOABS 0.6 05/29/2020 1530   EOSABS 0.1 05/29/2020 1530   BASOSABS 0.1 05/29/2020 1530    No results found for: "HGBA1C"  Assessment & Plan:  1. History of herpes genitalis Counseled on diagnosis and sexual precautions She has had no flares hence no need for suppressive therapy  2. Screening for STD (sexually transmitted disease) Cervicovaginal ancillary testing performed  3. Vaginal discharge - Cervicovaginal ancillary only  4. Screening for viral disease - HCV Ab w Reflex to Quant PCR - HIV Antibody (routine testing w rflx)  5. Nonintractable headache, unspecified chronicity pattern, unspecified headache type She has just had 1 episode of this hence I have placed her on an NSAID Counseled on symptoms to watch out for and to keep a headache diary If symptoms persist consider initiating prophylactic medication - naproxen (NAPROSYN) 500 MG tablet; Take 1 tablet (500 mg total) by mouth 2 (two) times daily with a meal.  Dispense: 60 tablet; Refill: 1  6. Need for immunization against influenza - Flu Vaccine QUAD 47mo+IM (Fluarix, Fluzone & Alfiuria Quad PF)  7. Need for Tdap vaccination - Tdap vaccine greater than or equal to 7yo IM   Health Care Maintenance: Will address HPV vaccine at next visit Meds ordered this encounter  Medications   naproxen (NAPROSYN) 500 MG tablet    Sig: Take 1 tablet (500 mg total) by mouth 2 (two) times daily with a meal.    Dispense:  60 tablet    Refill:  1    Follow-up: Return in about 3 months  (around 10/21/2022) for Headache.       Hoy Register, MD, FAAFP. [facility] [place], [place] [phone]   07/22/2022, 5:33 PM

## 2022-07-22 NOTE — Telephone Encounter (Signed)
Medication Refill - Medication: naproxen (NAPROSYN) 500 MG table  valACYclovir (VALTREX) 1000 MG tablet   Has the patient contacted their pharmacy? Yes.    Patient has switched her pharmacy   Preferred Pharmacy (with phone number or street name):  [facility] [place] Phone: [phone]  Fax: [phone]     Has the patient been seen for an appointment in the last year OR does the patient have an upcoming appointment? Yes.    Agent: Please be advised that RX refills may take up to 3 business days. We ask that you follow-up with your pharmacy.

## 2022-07-23 LAB — CERVICOVAGINAL ANCILLARY ONLY
Bacterial Vaginitis (gardnerella): POSITIVE — AB
Candida Glabrata: NEGATIVE
Candida Vaginitis: NEGATIVE
Chlamydia: POSITIVE — AB
Comment: NEGATIVE
Comment: NEGATIVE
Comment: NEGATIVE
Comment: NEGATIVE
Comment: NEGATIVE
Comment: NORMAL
Neisseria Gonorrhea: NEGATIVE
Trichomonas: NEGATIVE

## 2022-07-23 LAB — HCV AB W REFLEX TO QUANT PCR: HCV Ab: NONREACTIVE

## 2022-07-23 LAB — HIV ANTIBODY (ROUTINE TESTING W REFLEX): HIV Screen 4th Generation wRfx: NONREACTIVE

## 2022-07-23 LAB — HCV INTERPRETATION

## 2022-07-24 ENCOUNTER — Other Ambulatory Visit: Payer: Self-pay | Admitting: Family Medicine

## 2022-07-24 ENCOUNTER — Other Ambulatory Visit: Payer: Self-pay

## 2022-07-24 MED ORDER — DOXYCYCLINE HYCLATE 100 MG PO TABS
100.0000 mg | ORAL_TABLET | Freq: Two times a day (BID) | ORAL | 0 refills | Status: DC
  Filled 2022-07-24: qty 14, 7d supply, fill #0

## 2022-07-24 MED ORDER — METRONIDAZOLE 500 MG PO TABS
500.0000 mg | ORAL_TABLET | Freq: Two times a day (BID) | ORAL | 0 refills | Status: DC
  Filled 2022-07-24: qty 14, 7d supply, fill #0

## 2022-07-28 ENCOUNTER — Telehealth: Payer: Self-pay | Admitting: Family Medicine

## 2022-07-28 NOTE — Telephone Encounter (Signed)
Routing to PCP review. 

## 2022-07-28 NOTE — Telephone Encounter (Signed)
Patient's mother would like to know if doxycycline (VIBRA-TABS) 100 MG tablet and metroNIDAZOLE (FLAGYL) 500 MG tablet can be dispensed as a liquid. Patient has a hard time swallowing pills.

## 2022-07-29 ENCOUNTER — Other Ambulatory Visit: Payer: Self-pay | Admitting: Family Medicine

## 2022-07-29 ENCOUNTER — Telehealth: Payer: Self-pay

## 2022-07-29 ENCOUNTER — Other Ambulatory Visit: Payer: Self-pay | Admitting: Pharmacist

## 2022-07-29 ENCOUNTER — Other Ambulatory Visit: Payer: Self-pay

## 2022-07-29 MED ORDER — DOXYCYCLINE MONOHYDRATE 25 MG/5ML PO SUSR
100.0000 mg | Freq: Two times a day (BID) | ORAL | 0 refills | Status: DC
  Filled 2022-07-29: qty 1400, 35d supply, fill #0

## 2022-07-29 MED ORDER — METRONIDAZOLE 0.75 % VA GEL
1.0000 | Freq: Every day | VAGINAL | 0 refills | Status: DC
  Filled 2022-07-29: qty 70, 5d supply, fill #0

## 2022-07-29 MED ORDER — DOXYCYCLINE MONOHYDRATE 25 MG/5ML PO SUSR
100.0000 mg | Freq: Two times a day (BID) | ORAL | 0 refills | Status: DC
  Filled 2022-07-29 – 2022-07-30 (×2): qty 300, 8d supply, fill #0

## 2022-07-29 MED ORDER — METRONIDAZOLE 50 MG/ML ORAL SUSPENSION
500.0000 mg | Freq: Two times a day (BID) | ORAL | 0 refills | Status: DC
  Filled 2022-07-29 – 2023-04-26 (×2): qty 140, 7d supply, fill #0

## 2022-07-29 NOTE — Telephone Encounter (Signed)
I have sent prescription for suspension to the pharmacy.

## 2022-07-29 NOTE — Telephone Encounter (Signed)
Prior auth for doxycycline suspension has been submitted to patients ins today via covermymeds Key: [facility]

## 2022-07-29 NOTE — Telephone Encounter (Signed)
VM has been left with the patient informing her of the medication being sent tot the pharmacy.

## 2022-07-30 ENCOUNTER — Other Ambulatory Visit: Payer: Self-pay

## 2022-07-30 NOTE — Telephone Encounter (Signed)
Approved until 07/29/23

## 2022-07-31 ENCOUNTER — Other Ambulatory Visit: Payer: Self-pay

## 2022-08-04 ENCOUNTER — Other Ambulatory Visit: Payer: Self-pay

## 2022-08-14 DIAGNOSIS — Z419 Encounter for procedure for purposes other than remedying health state, unspecified: Secondary | ICD-10-CM | POA: Diagnosis not present

## 2022-09-12 DIAGNOSIS — Z419 Encounter for procedure for purposes other than remedying health state, unspecified: Secondary | ICD-10-CM | POA: Diagnosis not present

## 2022-10-13 DIAGNOSIS — Z419 Encounter for procedure for purposes other than remedying health state, unspecified: Secondary | ICD-10-CM | POA: Diagnosis not present

## 2022-10-21 ENCOUNTER — Ambulatory Visit: Payer: Medicaid Other | Admitting: Family Medicine

## 2022-11-12 DIAGNOSIS — Z419 Encounter for procedure for purposes other than remedying health state, unspecified: Secondary | ICD-10-CM | POA: Diagnosis not present

## 2022-12-13 DIAGNOSIS — Z419 Encounter for procedure for purposes other than remedying health state, unspecified: Secondary | ICD-10-CM | POA: Diagnosis not present

## 2023-01-12 DIAGNOSIS — Z419 Encounter for procedure for purposes other than remedying health state, unspecified: Secondary | ICD-10-CM | POA: Diagnosis not present

## 2023-01-21 ENCOUNTER — Ambulatory Visit: Payer: Medicaid Other | Admitting: Physician Assistant

## 2023-02-12 DIAGNOSIS — Z419 Encounter for procedure for purposes other than remedying health state, unspecified: Secondary | ICD-10-CM | POA: Diagnosis not present

## 2023-02-17 ENCOUNTER — Encounter: Payer: Medicaid Other | Admitting: Family Medicine

## 2023-03-15 DIAGNOSIS — Z419 Encounter for procedure for purposes other than remedying health state, unspecified: Secondary | ICD-10-CM | POA: Diagnosis not present

## 2023-04-14 DIAGNOSIS — Z419 Encounter for procedure for purposes other than remedying health state, unspecified: Secondary | ICD-10-CM | POA: Diagnosis not present

## 2023-04-22 ENCOUNTER — Other Ambulatory Visit: Payer: Self-pay

## 2023-04-22 ENCOUNTER — Encounter (HOSPITAL_BASED_OUTPATIENT_CLINIC_OR_DEPARTMENT_OTHER): Payer: Self-pay

## 2023-04-22 ENCOUNTER — Emergency Department (HOSPITAL_BASED_OUTPATIENT_CLINIC_OR_DEPARTMENT_OTHER)
Admission: EM | Admit: 2023-04-22 | Discharge: 2023-04-22 | Disposition: A | Discharge status: Home / Self Care | Payer: Medicaid Other | Attending: Emergency Medicine | Admitting: Emergency Medicine

## 2023-04-22 DIAGNOSIS — N730 Acute parametritis and pelvic cellulitis: Secondary | ICD-10-CM

## 2023-04-22 DIAGNOSIS — N739 Female pelvic inflammatory disease, unspecified: Secondary | ICD-10-CM | POA: Insufficient documentation

## 2023-04-22 DIAGNOSIS — R109 Unspecified abdominal pain: Secondary | ICD-10-CM | POA: Diagnosis present

## 2023-04-22 LAB — WET PREP, GENITAL
Sperm: NONE SEEN
Trich, Wet Prep: NONE SEEN
WBC, Wet Prep HPF POC: <10 (ref <10)
Yeast Wet Prep HPF POC: NONE SEEN

## 2023-04-22 LAB — URINALYSIS, ROUTINE W REFLEX MICROSCOPIC
Bacteria, UA: NONE SEEN
Bilirubin Urine: NEGATIVE
Glucose, UA: NEGATIVE mg/dL
Hgb urine dipstick: NEGATIVE
Ketones, ur: 40 mg/dL — AB
Leukocytes,Ua: NEGATIVE
Nitrite: NEGATIVE
Protein, ur: 30 mg/dL — AB
Specific Gravity, Urine: 1.032 — ABNORMAL HIGH (ref 1.005–1.030)
pH: 5.5 (ref 5.0–8.0)

## 2023-04-22 LAB — CBC
HCT: 46.7 % — ABNORMAL HIGH (ref 36.0–46.0)
Hemoglobin: 15.4 g/dL — ABNORMAL HIGH (ref 12.0–15.0)
MCH: 26.8 pg (ref 26.0–34.0)
MCHC: 33 g/dL (ref 30.0–36.0)
MCV: 81.2 fL (ref 80.0–100.0)
Platelets: 284 10*3/uL (ref 150–400)
RBC: 5.75 MIL/uL — ABNORMAL HIGH (ref 3.87–5.11)
RDW: 13.8 % (ref 11.5–15.5)
WBC: 7.3 10*3/uL (ref 4.0–10.5)
nRBC: 0 % (ref 0.0–0.2)

## 2023-04-22 LAB — COMPREHENSIVE METABOLIC PANEL
ALT: 20 U/L (ref 0–44)
AST: 17 U/L (ref 15–41)
Albumin: 4.8 g/dL (ref 3.5–5.0)
Alkaline Phosphatase: 69 U/L (ref 38–126)
Anion gap: 9 (ref 5–15)
BUN: 9 mg/dL (ref 6–20)
CO2: 26 mmol/L (ref 22–32)
Calcium: 9.9 mg/dL (ref 8.9–10.3)
Chloride: 102 mmol/L (ref 98–111)
Creatinine, Ser: 0.81 mg/dL (ref 0.44–1.00)
GFR, Estimated: >60 mL/min (ref >60)
Glucose, Bld: 78 mg/dL (ref 70–99)
Potassium: 3.7 mmol/L (ref 3.5–5.1)
Sodium: 137 mmol/L (ref 135–145)
Total Bilirubin: 0.8 mg/dL (ref 0.3–1.2)
Total Protein: 8.7 g/dL — ABNORMAL HIGH (ref 6.5–8.1)

## 2023-04-22 LAB — HCG, QUANTITATIVE, PREGNANCY: hCG, Beta Chain, Quant, S: <1 m[IU]/mL (ref <5)

## 2023-04-22 LAB — LIPASE, BLOOD: Lipase: 25 U/L (ref 11–51)

## 2023-04-22 LAB — PREGNANCY, URINE: Preg Test, Ur: NEGATIVE

## 2023-04-22 MED ORDER — DOXYCYCLINE HYCLATE 100 MG PO CAPS
100.0000 mg | ORAL_CAPSULE | Freq: Two times a day (BID) | ORAL | 0 refills | Status: DC
  Filled 2023-04-22: qty 20, 10d supply, fill #0

## 2023-04-22 MED ORDER — CEFTRIAXONE SODIUM 500 MG IJ SOLR
500.0000 mg | Freq: Once | INTRAMUSCULAR | Status: AC
  Administered 2023-04-22: 500 mg via INTRAMUSCULAR
  Filled 2023-04-22: qty 500

## 2023-04-22 NOTE — ED Triage Notes (Signed)
Patient arrives with complaints of 10/10 abdominal pain x1 month. More pain on her left side. She also reports that she may have been exposed to an STD from her partner. Requesting all STD testing to be done.

## 2023-04-22 NOTE — ED Provider Notes (Signed)
[place] EMERGENCY DEPARTMENT AT [facility] Provider Note   CSN: [id number] Arrival date & time: 04/22/23  1834     History  Chief Complaint  Patient presents with   Abdominal Pain   Exposure to STD    Jeanne Harrison is a 22 y.o. female.  The history is provided by the patient and medical records. No language interpreter was used.  Abdominal Pain Exposure to STD Associated symptoms include abdominal pain.     22 year old female presenting with complaints of abdominal pain.  Patient report for the past month she has had recurrent abdominal discomfort.  States pain happen sporadically at different area in her abdomen sometimes dull sometimes sharp usually lasting for about 30 minutes and resolved.  There are no associated fever or chills no nausea vomiting diarrhea no constipation no urinary discomfort no vaginal bleeding or vaginal discharge.  States pain is 10 out of 10, denies any specific treatment tried.  Patient also mentioned that her boyfriend told her that she needs to come to the doctor approximately a month ago.  He did not allude to any specific reason.  She voiced concern for potential STI.  She did not endorse any vaginal bleeding or vaginal discharge or pain with sexual activities.  She denies her partner having any symptoms as well.  She mention she has had chlamydia infection in the past and symptoms felt somewhat similar.  She has irregular menstrual period  Home Medications Prior to Admission medications   Medication Sig Start Date End Date Taking? Authorizing Provider  metroNIDAZOLE (METROGEL) 0.75 % vaginal gel Apply 1 Application topically at bedtime for 5 days 07/29/22   Hoy Register, MD  doxycycline (VIBRAMYCIN) 25 MG/5ML SUSR Take 20 mLs (100 mg total) by mouth 2 (two) times daily. 07/29/22   Hoy Register, MD  metroNIDAZOLE (FLAGYL) 50 mg/ml oral suspension Take 10 mLs (500 mg total) by mouth 2 (two) times daily. 07/29/22   Hoy Register, MD   naproxen (NAPROSYN) 500 MG tablet Take 1 tablet (500 mg total) by mouth 2 (two) times daily with a meal. 07/22/22   Hoy Register, MD  valACYclovir (VALTREX) 1000 MG tablet At the start of an outbreak, take 1 tablet daily for 5 days. Patient not taking: Reported on 07/22/2022 09/11/20   Wallis Bamberg, PA-C      Allergies    Patient has no known allergies.    Review of Systems   Review of Systems  Gastrointestinal:  Positive for abdominal pain.  All other systems reviewed and are negative.   Physical Exam Updated Vital Signs BP (!) 148/120 (BP Location: Right Arm)   Pulse 89   Temp 98.5 F (36.9 C)   Resp 18   Ht 5\' 2"  (1.575 m)   Wt 114.8 kg   SpO2 100%   BMI 46.27 kg/m  Physical Exam Vitals and nursing note reviewed.  Constitutional:      General: She is not in acute distress.    Appearance: She is well-developed.  HENT:     Head: Atraumatic.  Eyes:     Conjunctiva/sclera: Conjunctivae normal.  Cardiovascular:     Rate and Rhythm: Normal rate and regular rhythm.  Pulmonary:     Effort: Pulmonary effort is normal.  Abdominal:     Palpations: Abdomen is soft.     Tenderness: There is no abdominal tenderness. There is no right CVA tenderness or left CVA tenderness.  Musculoskeletal:     Cervical back: Neck supple.  Skin:  Findings: No rash.  Neurological:     Mental Status: She is alert.  Psychiatric:        Mood and Affect: Mood normal.     ED Results / Procedures / Treatments   Labs (all labs ordered are listed, but only abnormal results are displayed) Labs Reviewed  WET PREP, GENITAL - Abnormal; Notable for the following components:      Result Value   Clue Cells Wet Prep HPF POC PRESENT (*)    All other components within normal limits  COMPREHENSIVE METABOLIC PANEL - Abnormal; Notable for the following components:   Total Protein 8.7 (*)    All other components within normal limits  CBC - Abnormal; Notable for the following components:   RBC 5.75 (*)     Hemoglobin 15.4 (*)    HCT 46.7 (*)    All other components within normal limits  URINALYSIS, ROUTINE W REFLEX MICROSCOPIC - Abnormal; Notable for the following components:   APPearance HAZY (*)    Specific Gravity, Urine 1.032 (*)    Ketones, ur 40 (*)    Protein, ur 30 (*)    All other components within normal limits  LIPASE, BLOOD  PREGNANCY, URINE  HCG, QUANTITATIVE, PREGNANCY  GC/CHLAMYDIA PROBE AMP ([place]) NOT AT [facility]    EKG None  Radiology No results found.  Procedures Pelvic exam  Date/Time: 04/22/2023 10:37 PM  Performed by: Fayrene Helper, PA-C Authorized by: Fayrene Helper, PA-C  Comments: Chaperone present during exam.  No inguinal neuropathy or inguinal hernia noted.  Normal external genitalia without any signs of active herpetic lesion.  No significant discomfort with speculum insertion.  Normal vaginal vault no significant vaginal discharge or odor.  Close cervical os.  On bimanual exam patient has left adnexal tenderness and some cervical motion tenderness however exam is limited due to body habitus.       Medications Ordered in ED Medications  cefTRIAXone (ROCEPHIN) injection 500 mg (500 mg Intramuscular Given 04/22/23 2249)    ED Course/ Medical Decision Making/ A&P                                 Medical Decision Making Amount and/or Complexity of Data Reviewed Labs: ordered.  Risk Prescription drug management.   BP (!) 148/120 (BP Location: Right Arm)   Pulse 89   Temp 98.5 F (36.9 C)   Resp 18   Ht 5\' 2"  (1.575 m)   Wt 114.8 kg   SpO2 100%   BMI 46.27 kg/m   33:82 PM  22 year old female presenting with complaints of abdominal pain.  Patient report for the past month she has had recurrent abdominal discomfort.  States pain happen sporadically at different area in her abdomen sometimes dull sometimes sharp usually lasting for about 30 minutes and resolved.  There are no associated fever or chills no nausea vomiting diarrhea no  constipation no urinary discomfort no vaginal bleeding or vaginal discharge.  States pain is 10 out of 10, denies any specific treatment tried.  Patient also mentioned that her boyfriend told her that she needs to come to the doctor approximately a month ago.  He did not allude to any specific reason.  She voiced concern for potential STI.  She did not endorse any vaginal bleeding or vaginal discharge or pain with sexual activities.  She denies her partner having any symptoms as well.  She mention she has had chlamydia  infection in the past and symptoms felt somewhat similar.  She has irregular menstrual period  On exam, patient is resting comfortably in bed appears to be in no acute discomfort.  Heart with normal rate and rhythm, lungs clear to auscultation bilaterally abdomen is soft nontender no CVA tenderness.  -Labs ordered, independently viewed and interpreted by me.  Labs remarkable for reassuring, preg test negative, normal electrolytes -The patient was maintained on a cardiac monitor.  I personally viewed and interpreted the cardiac monitored which showed an underlying rhythm of: NSR -Imaging including abd/pelvis CT scan and transvaginal US considered but low suspicion of acute emergent pathology -This patient presents to the ED for concern of abd pain, this involves an extensive number of treatment options, and is a complaint that carries with it a high risk of complications and morbidity.  The differential diagnosis includes stress, anxiety, gastritis, GERD, PID, cervicitis, appendicitis, colitis, TOA, ovarian torsion, ectopic pregnancy -Co morbidities that complicate the patient evaluation includes hx of STI -Treatment includes rocephin -Reevaluation of the patient after these medicines showed that the patient improved -PCP office notes or outside notes reviewed -Escalation to admission/observation considered: patients feels much better, is comfortable with discharge, and will follow up with  PCP -Prescription medication considered, patient comfortable with doxycycline -Social Determinant of Health considered which includes depression  Although evidence of clue systems presents, presentation is not consistent with BV.  Will discharge home with doxycycline for suspected PID.  Return precaution given.        Final Clinical Impression(s) / ED Diagnoses Final diagnoses:  PID (acute pelvic inflammatory disease)    Rx / DC Orders ED Discharge Orders          Ordered    doxycycline (VIBRAMYCIN) 100 MG capsule  2 times daily        04/22/23 2322              Fayrene Helper, PA-C 04/22/23 2323    Benjiman Core, MD 04/23/23 [phone]

## 2023-04-22 NOTE — ED Notes (Signed)
Reviewed AVS with patient, patient expressed understanding of directions, denies further questions at this time. 

## 2023-04-22 NOTE — Discharge Instructions (Signed)
You have been screened for your symptoms.  You have been given antibiotic to treat for potential pelvic inflammatory disease.  You may check the results of your labs on MyChart, link below.  Take antibiotic for the full duration.  Take with food.

## 2023-04-23 ENCOUNTER — Other Ambulatory Visit: Payer: Self-pay

## 2023-04-24 LAB — GC/CHLAMYDIA PROBE AMP (~~LOC~~) NOT AT ARMC
Chlamydia: POSITIVE — AB
Comment: NEGATIVE
Comment: NORMAL
Neisseria Gonorrhea: POSITIVE — AB

## 2023-04-26 ENCOUNTER — Other Ambulatory Visit: Payer: Self-pay | Admitting: Family Medicine

## 2023-04-27 ENCOUNTER — Other Ambulatory Visit: Payer: Self-pay

## 2023-04-27 MED ORDER — METRONIDAZOLE 0.75 % VA GEL
1.0000 | Freq: Every day | VAGINAL | 0 refills | Status: AC
  Filled 2023-04-27: qty 70, 7d supply, fill #0

## 2023-05-07 ENCOUNTER — Other Ambulatory Visit: Payer: Self-pay

## 2023-05-15 DIAGNOSIS — Z419 Encounter for procedure for purposes other than remedying health state, unspecified: Secondary | ICD-10-CM | POA: Diagnosis not present

## 2023-06-14 DIAGNOSIS — Z419 Encounter for procedure for purposes other than remedying health state, unspecified: Secondary | ICD-10-CM | POA: Diagnosis not present

## 2023-07-15 DIAGNOSIS — Z419 Encounter for procedure for purposes other than remedying health state, unspecified: Secondary | ICD-10-CM | POA: Diagnosis not present

## 2023-08-15 DIAGNOSIS — Z419 Encounter for procedure for purposes other than remedying health state, unspecified: Secondary | ICD-10-CM | POA: Diagnosis not present

## 2023-09-12 DIAGNOSIS — Z419 Encounter for procedure for purposes other than remedying health state, unspecified: Secondary | ICD-10-CM | POA: Diagnosis not present

## 2023-10-24 DIAGNOSIS — Z419 Encounter for procedure for purposes other than remedying health state, unspecified: Secondary | ICD-10-CM | POA: Diagnosis not present

## 2023-11-17 IMAGING — US US ABDOMEN LIMITED
1 series · 14 of 25 positions shown · non-contrast
Comparison: None Available.

CLINICAL DATA: Right upper quadrant pain.

EXAM:
ULTRASOUND ABDOMEN LIMITED RIGHT UPPER QUADRANT

[Series 1: us abdomen limited ruq (liver/gb) · 14 of 43 slices shown]
[im 1/43]
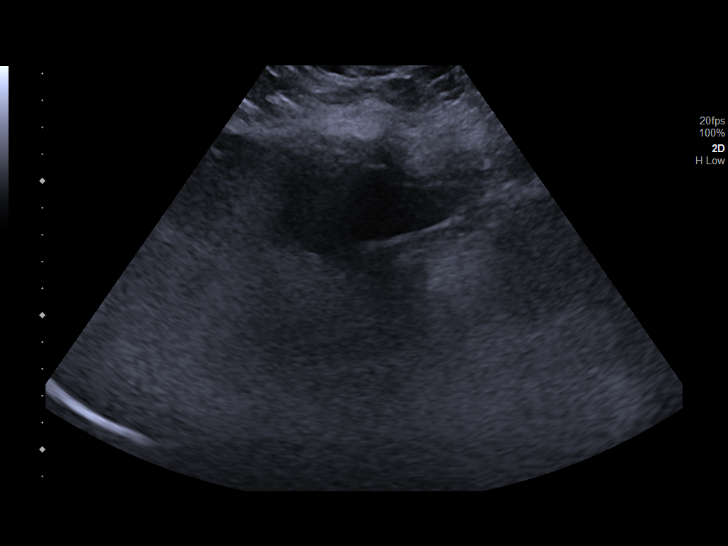
[im 4/43]
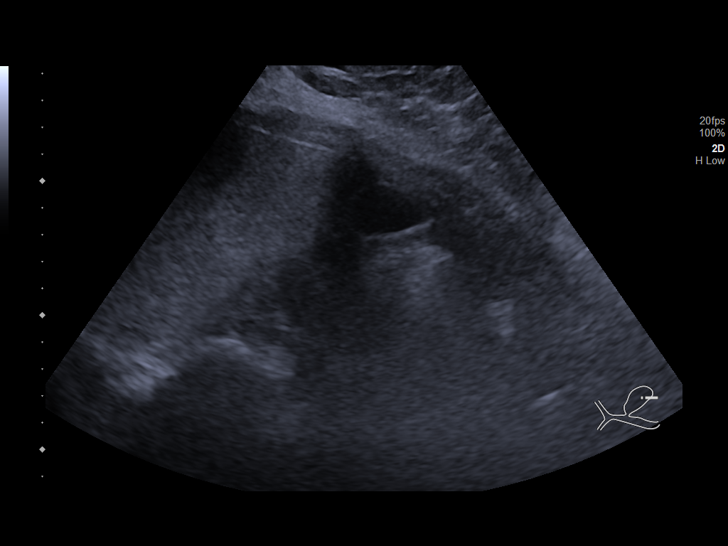
[im 8/43]
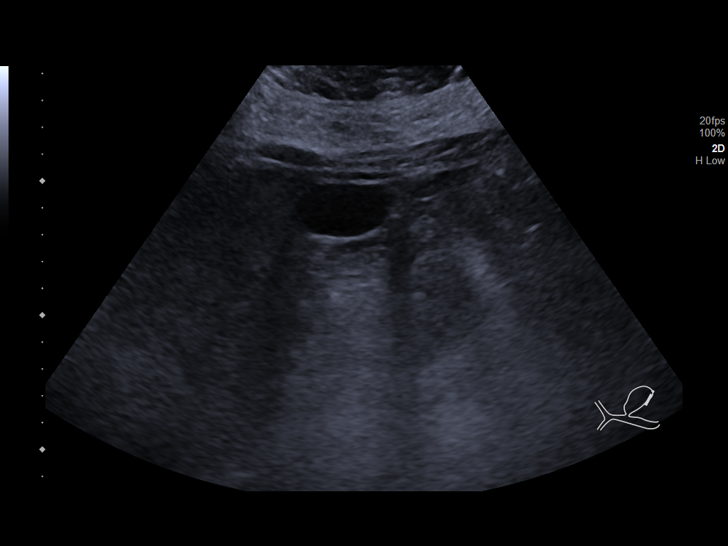
[im 11/43]
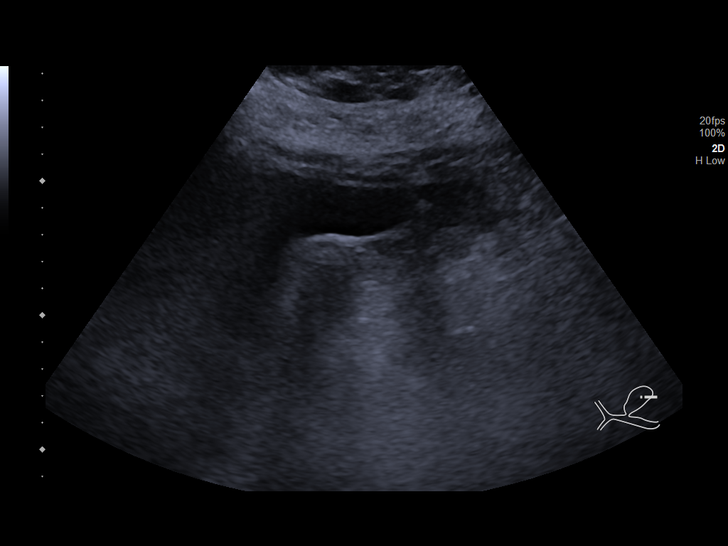
[im 15/43]
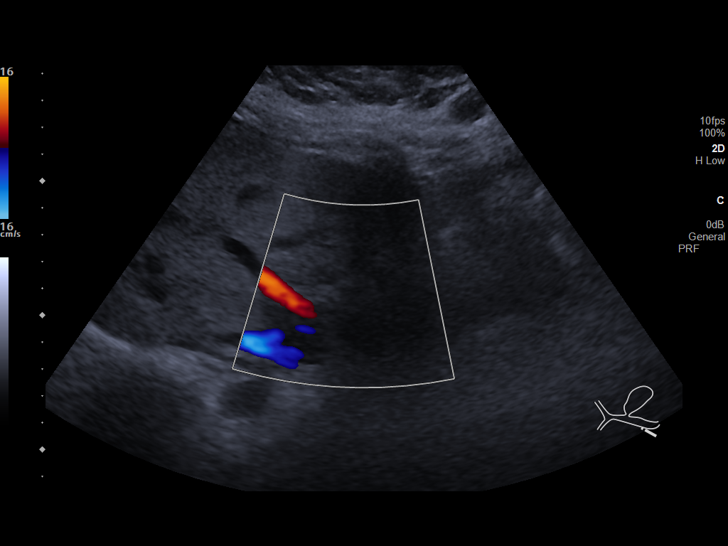
[im 16/43]
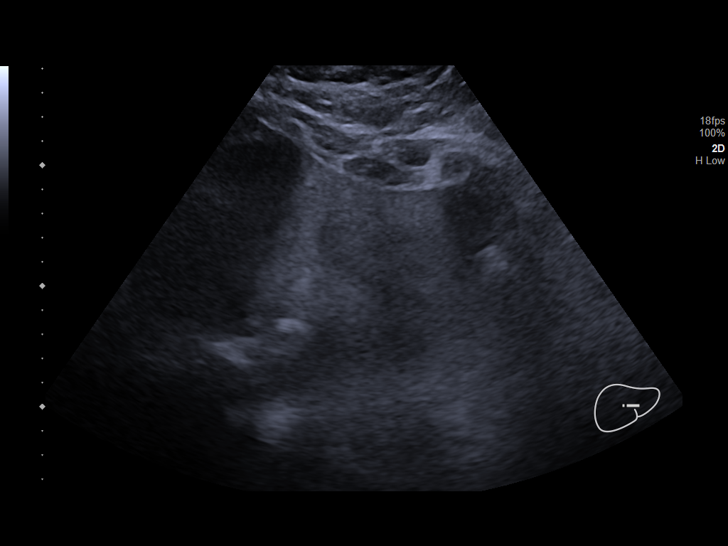
[im 20/43]
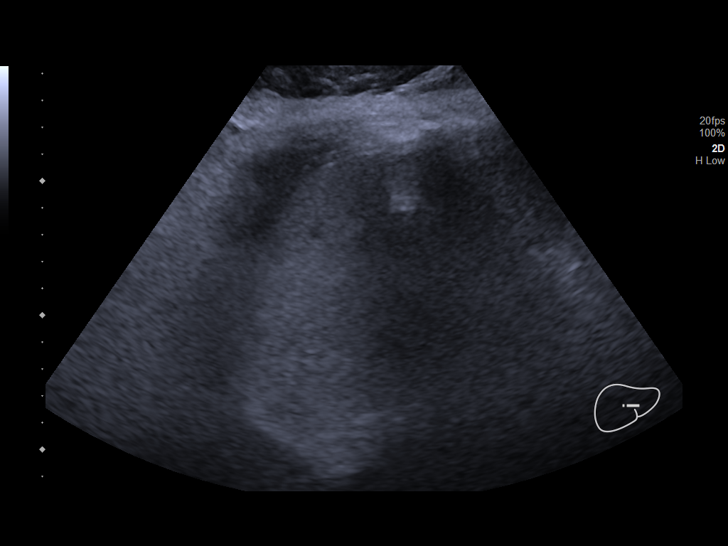
[im 23/43]
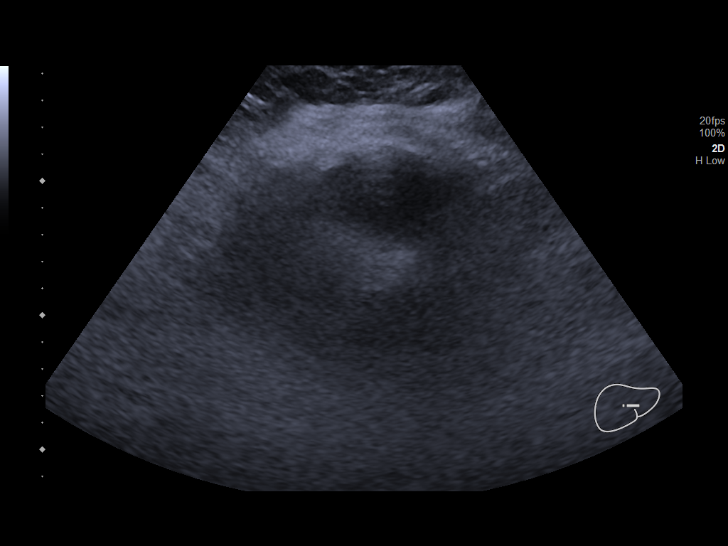
[im 27/43]
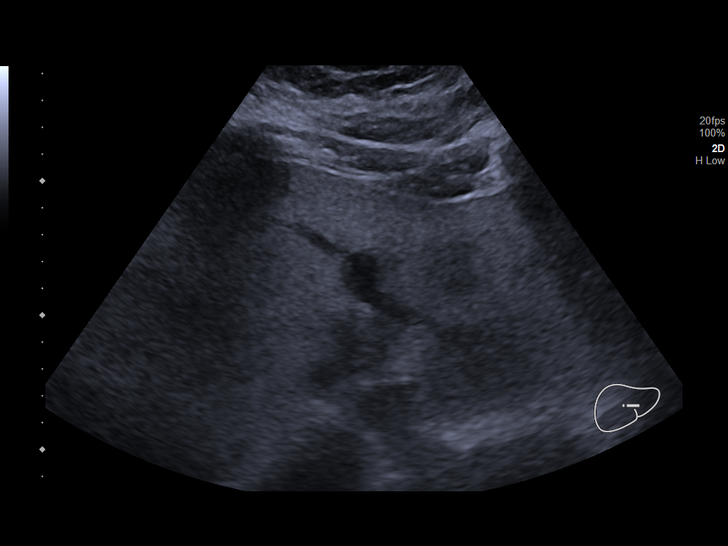
[im 29/43]
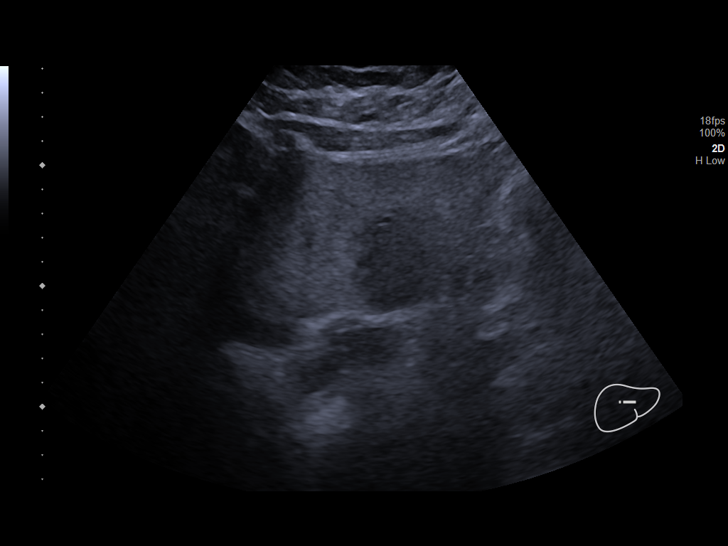
[im 32/43]
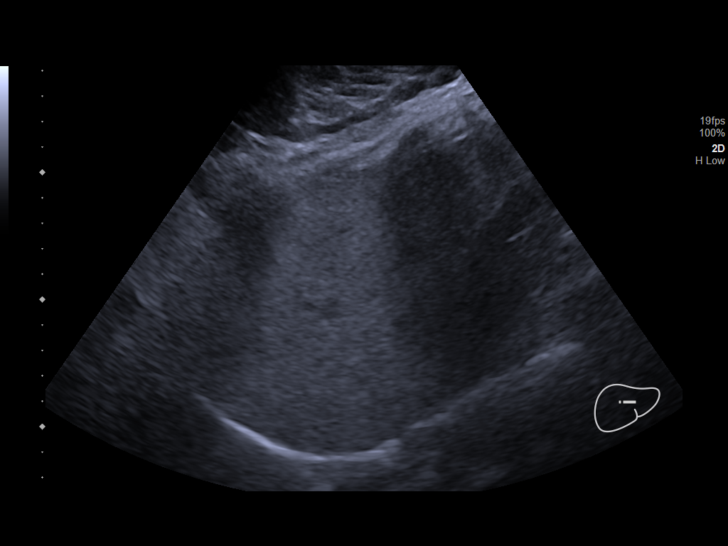
[im 36/43]
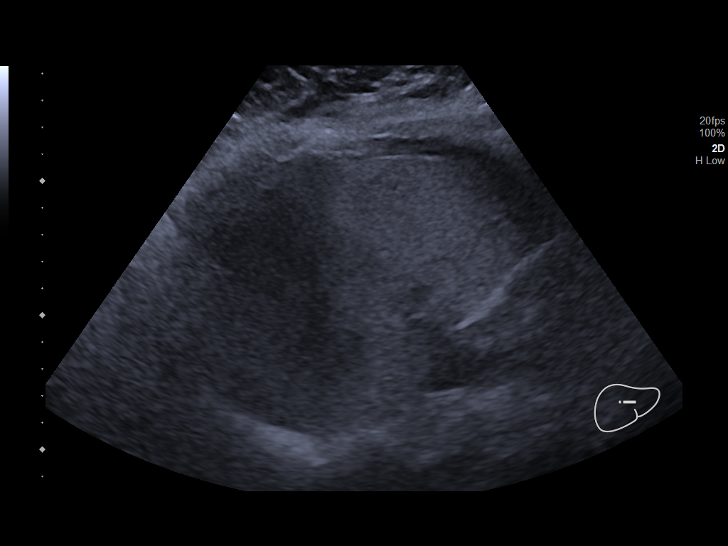
[im 39/43]
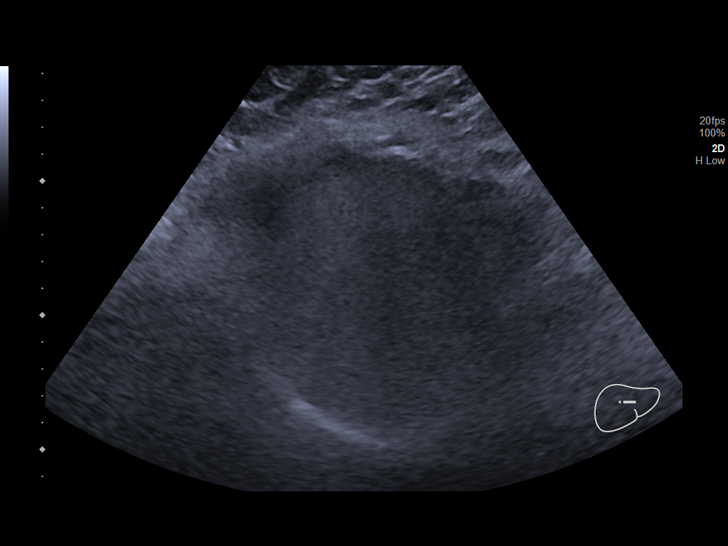
[im 43/43]
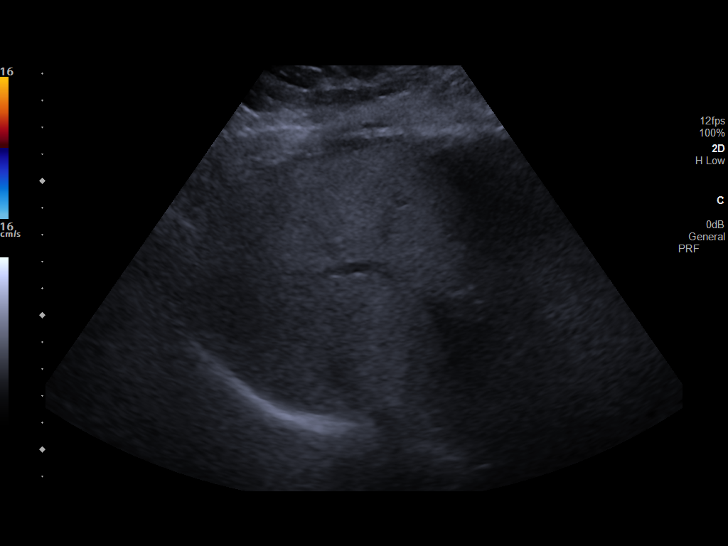

[14 of 25 positions shown; findings below may reference images not displayed]

FINDINGS: Gallbladder:

No gallstones or wall thickening visualized. No sonographic Murphy
sign noted by sonographer.

Common bile duct:

Diameter: 3.6 mm with no visible intrahepatic biliary prominence.

Liver:

No focal lesion identified. The liver is diffusely attenuating and
echogenic consistent with fatty replacement. Portal vein is patent
on color Doppler imaging with normal direction of blood flow towards
the liver.

Other: None.
IMPRESSION: 1. No evidence of cholelithiasis or acute cholecystitis.
2. Diffusely echogenic liver consistent with steatosis.

## 2023-11-23 DIAGNOSIS — Z419 Encounter for procedure for purposes other than remedying health state, unspecified: Secondary | ICD-10-CM | POA: Diagnosis not present

## 2023-12-24 DIAGNOSIS — Z419 Encounter for procedure for purposes other than remedying health state, unspecified: Secondary | ICD-10-CM | POA: Diagnosis not present

## 2024-01-12 ENCOUNTER — Emergency Department (HOSPITAL_BASED_OUTPATIENT_CLINIC_OR_DEPARTMENT_OTHER)

## 2024-01-12 ENCOUNTER — Encounter (HOSPITAL_BASED_OUTPATIENT_CLINIC_OR_DEPARTMENT_OTHER): Payer: Self-pay | Admitting: Emergency Medicine

## 2024-01-12 ENCOUNTER — Other Ambulatory Visit: Payer: Self-pay

## 2024-01-12 ENCOUNTER — Inpatient Hospital Stay (HOSPITAL_BASED_OUTPATIENT_CLINIC_OR_DEPARTMENT_OTHER)
Admission: EM | Admit: 2024-01-12 | Discharge: 2024-01-15 | DRG: 392 | Disposition: A | Discharge status: Home / Self Care | Attending: Internal Medicine | Admitting: Internal Medicine

## 2024-01-12 DIAGNOSIS — A419 Sepsis, unspecified organism: Secondary | ICD-10-CM | POA: Diagnosis present

## 2024-01-12 DIAGNOSIS — E669 Obesity, unspecified: Secondary | ICD-10-CM | POA: Insufficient documentation

## 2024-01-12 DIAGNOSIS — Z6841 Body Mass Index (BMI) 40.0 and over, adult: Secondary | ICD-10-CM

## 2024-01-12 DIAGNOSIS — E872 Acidosis, unspecified: Secondary | ICD-10-CM | POA: Diagnosis present

## 2024-01-12 DIAGNOSIS — Z841 Family history of disorders of kidney and ureter: Secondary | ICD-10-CM

## 2024-01-12 DIAGNOSIS — D72829 Elevated white blood cell count, unspecified: Secondary | ICD-10-CM | POA: Diagnosis present

## 2024-01-12 DIAGNOSIS — A059 Bacterial foodborne intoxication, unspecified: Principal | ICD-10-CM | POA: Diagnosis present

## 2024-01-12 DIAGNOSIS — Z833 Family history of diabetes mellitus: Secondary | ICD-10-CM

## 2024-01-12 DIAGNOSIS — R6510 Systemic inflammatory response syndrome (SIRS) of non-infectious origin without acute organ dysfunction: Principal | ICD-10-CM | POA: Diagnosis present

## 2024-01-12 DIAGNOSIS — Z8249 Family history of ischemic heart disease and other diseases of the circulatory system: Secondary | ICD-10-CM

## 2024-01-12 DIAGNOSIS — Z79899 Other long term (current) drug therapy: Secondary | ICD-10-CM

## 2024-01-12 DIAGNOSIS — R680 Hypothermia, not associated with low environmental temperature: Secondary | ICD-10-CM | POA: Diagnosis present

## 2024-01-12 DIAGNOSIS — R1031 Right lower quadrant pain: Secondary | ICD-10-CM | POA: Diagnosis not present

## 2024-01-12 DIAGNOSIS — R1030 Lower abdominal pain, unspecified: Secondary | ICD-10-CM | POA: Diagnosis not present

## 2024-01-12 LAB — COMPREHENSIVE METABOLIC PANEL WITH GFR
ALT: 23 U/L (ref 0–44)
AST: 21 U/L (ref 15–41)
Albumin: 4.6 g/dL (ref 3.5–5.0)
Alkaline Phosphatase: 110 U/L (ref 38–126)
Anion gap: 14 (ref 5–15)
BUN: 10 mg/dL (ref 6–20)
CO2: 23 mmol/L (ref 22–32)
Calcium: 9.9 mg/dL (ref 8.9–10.3)
Chloride: 102 mmol/L (ref 98–111)
Creatinine, Ser: 0.79 mg/dL (ref 0.44–1.00)
GFR, Estimated: >60 mL/min (ref >60)
Glucose, Bld: 138 mg/dL — ABNORMAL HIGH (ref 70–99)
Potassium: 3.9 mmol/L (ref 3.5–5.1)
Sodium: 138 mmol/L (ref 135–145)
Total Bilirubin: 0.6 mg/dL (ref 0.0–1.2)
Total Protein: 8.9 g/dL — ABNORMAL HIGH (ref 6.5–8.1)

## 2024-01-12 LAB — CBC WITH DIFFERENTIAL/PLATELET
Abs Immature Granulocytes: 0.04 10*3/uL (ref 0.00–0.07)
Basophils Absolute: 0 10*3/uL (ref 0.0–0.1)
Basophils Relative: 0 %
Eosinophils Absolute: 0 10*3/uL (ref 0.0–0.5)
Eosinophils Relative: 0 %
HCT: 46.1 % — ABNORMAL HIGH (ref 36.0–46.0)
Hemoglobin: 14.9 g/dL (ref 12.0–15.0)
Immature Granulocytes: 0 %
Lymphocytes Relative: 9 %
Lymphs Abs: 1.2 10*3/uL (ref 0.7–4.0)
MCH: 26.3 pg (ref 26.0–34.0)
MCHC: 32.3 g/dL (ref 30.0–36.0)
MCV: 81.4 fL (ref 80.0–100.0)
Monocytes Absolute: 0.7 10*3/uL (ref 0.1–1.0)
Monocytes Relative: 5 %
Neutro Abs: 11.5 10*3/uL — ABNORMAL HIGH (ref 1.7–7.7)
Neutrophils Relative %: 86 %
Platelets: 295 10*3/uL (ref 150–400)
RBC: 5.66 MIL/uL — ABNORMAL HIGH (ref 3.87–5.11)
RDW: 14 % (ref 11.5–15.5)
WBC: 13.5 10*3/uL — ABNORMAL HIGH (ref 4.0–10.5)
nRBC: 0 % (ref 0.0–0.2)

## 2024-01-12 LAB — RESP PANEL BY RT-PCR (RSV, FLU A&B, COVID)  RVPGX2
Influenza A by PCR: NEGATIVE
Influenza B by PCR: NEGATIVE
Resp Syncytial Virus by PCR: NEGATIVE
SARS Coronavirus 2 by RT PCR: NEGATIVE

## 2024-01-12 LAB — WET PREP, GENITAL
Sperm: NONE SEEN
Trich, Wet Prep: NONE SEEN
WBC, Wet Prep HPF POC: <10 (ref <10)
Yeast Wet Prep HPF POC: NONE SEEN

## 2024-01-12 LAB — URINALYSIS, ROUTINE W REFLEX MICROSCOPIC
Bacteria, UA: NONE SEEN
Bilirubin Urine: NEGATIVE
Glucose, UA: NEGATIVE mg/dL
Hgb urine dipstick: NEGATIVE
Ketones, ur: NEGATIVE mg/dL
Leukocytes,Ua: NEGATIVE
Nitrite: NEGATIVE
Specific Gravity, Urine: >1.046 — ABNORMAL HIGH (ref 1.005–1.030)
pH: 7.5 (ref 5.0–8.0)

## 2024-01-12 LAB — HCG, SERUM, QUALITATIVE: Preg, Serum: NEGATIVE

## 2024-01-12 LAB — LACTIC ACID, PLASMA
Lactic Acid, Venous: 3.5 mmol/L (ref 0.5–1.9)
Lactic Acid, Venous: 2.9 mmol/L (ref 0.5–1.9)

## 2024-01-12 LAB — LIPASE, BLOOD: Lipase: 33 U/L (ref 11–51)

## 2024-01-12 MED ORDER — PIPERACILLIN-TAZOBACTAM 3.375 G IVPB 30 MIN
3.3750 g | Freq: Once | INTRAVENOUS | Status: AC
  Administered 2024-01-12: 3.375 g via INTRAVENOUS
  Filled 2024-01-12: qty 50

## 2024-01-12 MED ORDER — LACTATED RINGERS IV BOLUS
1000.0000 mL | Freq: Once | INTRAVENOUS | Status: AC
  Administered 2024-01-13: 1000 mL via INTRAVENOUS

## 2024-01-12 MED ORDER — PIPERACILLIN-TAZOBACTAM 3.375 G IVPB: 3.3750 g | Freq: Three times a day (TID) | INTRAVENOUS | Status: DC

## 2024-01-12 MED ORDER — LACTATED RINGERS IV BOLUS
1000.0000 mL | Freq: Once | INTRAVENOUS | Status: AC
  Administered 2024-01-12: 1000 mL via INTRAVENOUS

## 2024-01-12 MED ORDER — ONDANSETRON HCL 4 MG/2ML IJ SOLN
4.0000 mg | Freq: Once | INTRAMUSCULAR | Status: AC
  Administered 2024-01-12: 4 mg via INTRAVENOUS
  Filled 2024-01-12: qty 2

## 2024-01-12 MED ORDER — HYDROMORPHONE HCL 1 MG/ML IJ SOLN
0.5000 mg | Freq: Once | INTRAMUSCULAR | Status: AC
  Administered 2024-01-12: 0.5 mg via INTRAVENOUS
  Filled 2024-01-12: qty 1

## 2024-01-12 MED ORDER — IOHEXOL 300 MG/ML  SOLN
100.0000 mL | Freq: Once | INTRAMUSCULAR | Status: AC | PRN
  Administered 2024-01-12: 100 mL via INTRAVENOUS

## 2024-01-12 MED ORDER — VANCOMYCIN HCL IN DEXTROSE 1-5 GM/200ML-% IV SOLN
1000.0000 mg | INTRAVENOUS | Status: AC
  Administered 2024-01-13: 1000 mg via INTRAVENOUS
  Filled 2024-01-12: qty 200

## 2024-01-12 NOTE — ED Provider Notes (Signed)
 [place] EMERGENCY DEPARTMENT AT [facility] Provider Note   CSN: [id number] Arrival date & time: 01/12/24  8082     Patient presents with: Abdominal Pain   Jeanne Harrison is a 23 y.o. female.   This is a 23 year old female presenting emergency department for generalized abdominal pain that started today.  Was feeling normal self this morning woke up, took a nap and woke up with belly pain.  Reports that it is all over, but slightly worse in her lower abdomen.  No prior surgical history.  She is nauseated, but did not throw up.  Small bowel movement yesterday.  Unsure when her last menstrual period was thinks was a couple months ago.  Has some urinary frequency, but no dysuria.   Abdominal Pain      Prior to Admission medications   Medication Sig Start Date End Date Taking? Authorizing Provider  doxycycline  (VIBRAMYCIN ) 100 MG capsule Take 1 capsule (100 mg total) by mouth 2 (two) times daily. 04/22/23   Nivia Colon, PA-C  naproxen  (NAPROSYN ) 500 MG tablet Take 1 tablet (500 mg total) by mouth 2 (two) times daily with a meal. 07/22/22   Delbert Clam, MD  valACYclovir  (VALTREX ) 1000 MG tablet At the start of an outbreak, take 1 tablet daily for 5 days. Patient not taking: Reported on 07/22/2022 09/11/20   Christopher Savannah, PA-C    Allergies: Patient has no known allergies.    Review of Systems  Gastrointestinal:  Positive for abdominal pain.    Updated Vital Signs BP 122/72   Pulse (!) 109   Temp 98 F (36.7 C)   Resp (!) 29   SpO2 98%   Physical Exam Vitals and nursing note reviewed.  Constitutional:      General: She is not in acute distress.    Appearance: She is obese. She is not toxic-appearing.  HENT:     Head: Normocephalic.  Cardiovascular:     Rate and Rhythm: Normal rate and regular rhythm.  Abdominal:     General: Abdomen is flat.     Palpations: Abdomen is soft.     Tenderness: There is generalized abdominal tenderness and tenderness in the right  lower quadrant, suprapubic area and left lower quadrant.  Neurological:     General: No focal deficit present.     Mental Status: She is alert.  Psychiatric:        Mood and Affect: Mood normal.        Behavior: Behavior normal.     (all labs ordered are listed, but only abnormal results are displayed) Labs Reviewed  CBC WITH DIFFERENTIAL/PLATELET - Abnormal; Notable for the following components:      Result Value   WBC 13.5 (*)    RBC 5.66 (*)    HCT 46.1 (*)    Neutro Abs 11.5 (*)    All other components within normal limits  COMPREHENSIVE METABOLIC PANEL WITH GFR - Abnormal; Notable for the following components:   Glucose, Bld 138 (*)    Total Protein 8.9 (*)    All other components within normal limits  URINALYSIS, ROUTINE W REFLEX MICROSCOPIC - Abnormal; Notable for the following components:   Color, Urine COLORLESS (*)    Specific Gravity, Urine >1.046 (*)    Protein, ur TRACE (*)    All other components within normal limits  LACTIC ACID, PLASMA - Abnormal; Notable for the following components:   Lactic Acid, Venous 3.5 (*)    All other components within normal  limits  LACTIC ACID, PLASMA - Abnormal; Notable for the following components:   Lactic Acid, Venous 2.9 (*)    All other components within normal limits  RESP PANEL BY RT-PCR (RSV, FLU A&B, COVID)  [id number]  CULTURE, BLOOD (ROUTINE X 2)  CULTURE, BLOOD (ROUTINE X 2)  WET PREP, GENITAL  LIPASE, BLOOD  HCG, SERUM, QUALITATIVE  CBG MONITORING, ED  GC/CHLAMYDIA PROBE AMP ([place]) NOT AT [facility]    EKG: None  Radiology: CT ABDOMEN PELVIS W CONTRAST Result Date: 01/12/2024 CLINICAL DATA:  Right lower quadrant pain. EXAM: CT ABDOMEN AND PELVIS WITH CONTRAST TECHNIQUE: Multidetector CT imaging of the abdomen and pelvis was performed using the standard protocol following bolus administration of intravenous contrast. RADIATION DOSE REDUCTION: This exam was performed according to the departmental dose-optimization  program which includes automated exposure control, adjustment of the mA and/or kV according to patient size and/or use of iterative reconstruction technique. CONTRAST:  100mL OMNIPAQUE IOHEXOL 300 MG/ML  SOLN COMPARISON:  None Available. FINDINGS: Lower chest: No acute abnormality. Hepatobiliary: No focal liver abnormality is seen. No gallstones, gallbladder wall thickening, or biliary dilatation. Pancreas: Unremarkable. No pancreatic ductal dilatation or surrounding inflammatory changes. Spleen: Normal in size without focal abnormality. Adrenals/Urinary Tract: Adrenal glands are unremarkable. Kidneys are normal, without renal calculi, focal lesion, or hydronephrosis. Bladder is unremarkable. Stomach/Bowel: Stomach is within normal limits. Appendix appears normal. No evidence of bowel wall thickening, distention, or inflammatory changes. Vascular/Lymphatic: No significant vascular findings are present. No enlarged abdominal or pelvic lymph nodes. Reproductive: Uterus and bilateral adnexa are unremarkable. Other: No abdominal wall hernia or abnormality. A mild amount of free fluid is seen within the lower abdomen and pelvis. Musculoskeletal: No acute or significant osseous findings. IMPRESSION: 1. Mild amount of free fluid within the lower abdomen and pelvis likely, in part, physiologic. 2. Normal appendix. Electronically Signed   By: Suzen Dials M.D.   On: 01/12/2024 21:40     .Critical Care  Performed by: Neysa Caron PARAS, DO Authorized by: Neysa Caron PARAS, DO   Critical care provider statement:    Critical care time (minutes):  30   Critical care was necessary to treat or prevent imminent or life-threatening deterioration of the following conditions:  Sepsis   Critical care was time spent personally by me on the following activities:  Development of treatment plan with patient or surrogate, discussions with consultants, evaluation of patient's response to treatment, examination of patient, ordering  and review of laboratory studies, ordering and review of radiographic studies, ordering and performing treatments and interventions, pulse oximetry, re-evaluation of patient's condition and review of old charts    Medications Ordered in the ED  piperacillin-tazobactam (ZOSYN) IVPB 3.375 g (has no administration in time range)  lactated ringers bolus 1,000 mL (has no administration in time range)  vancomycin (VANCOCIN) IVPB 1000 mg/200 mL premix (has no administration in time range)  HYDROmorphone (DILAUDID) injection 0.5 mg (0.5 mg Intravenous Given 01/12/24 2001)  lactated ringers bolus 1,000 mL (0 mLs Intravenous Stopped 01/12/24 2141)  ondansetron  (ZOFRAN ) injection 4 mg (4 mg Intravenous Given 01/12/24 2003)  iohexol (OMNIPAQUE) 300 MG/ML solution 100 mL (100 mLs Intravenous Contrast Given 01/12/24 2105)  piperacillin-tazobactam (ZOSYN) IVPB 3.375 g (0 g Intravenous Stopped 01/12/24 2212)  HYDROmorphone (DILAUDID) injection 0.5 mg (0.5 mg Intravenous Given 01/12/24 2138)  HYDROmorphone (DILAUDID) injection 0.5 mg (0.5 mg Intravenous Given 01/12/24 2325)    Clinical Course as of 01/12/24 2346  Tue Jan 12, 2024  2124  Patient with leukocytosis elevated lactate she is tachycardic, borderline hypothermic.  Does have diffusely tender abdomen.  Zosyn ordered for infectious coverage. [TY]  2126 Comprehensive metabolic panel(!) No metabolic derangements.  Normal kidney function.  No transaminitis to suggest hepatobiliary disease. [TY]  2126 Lipase: 33 Pancreatitis unlikely [TY]  2157 CT ABDOMEN PELVIS W CONTRAST IMPRESSION: 1. Mild amount of free fluid within the lower abdomen and pelvis likely, in part, physiologic. 2. Normal appendix.   Electronically Signed   [TY]  [id number] Patient with sepsis; unclear source at this time.  Hypothermic, tachycardic elevated lactate and leukocytosis.  CT scan largely reassuring.  UA negative.  Chest x-ray to my better interpretation no obvious pneumonia pneumothorax.   Received Zosyn, vancomycin ordered.  Unclear source.  Per chart review did have history of GC chlamydia.  Not having vaginal discharge or pain.  Ordered wet prep and GC chlamydia.  Given patient's septic vitals and labs will admit for further antibiotics.  Case discussed with Dr. Shona who agrees to admit patient. [TY]    Clinical Course User Index [TY] Neysa Caron PARAS, DO                                 Medical Decision Making This is a 23 year old female morbidly obese presenting the emergency department for abdominal pain.  No prior surgical history per chart review.  Mother notes that patient does have history of constipation.  She is afebrile, but is tachycardic, slightly hypertensive.  She has a soft abdomen on exam, with diffuse tenderness that is worse in the lower quadrants.  Will get screening labs and CT scan to evaluate for appendicitis, diverticulitis, obstruction, constipation.  IV fluids ordered.  Patient does appear to be in severe pain given IV Dilaudid.  Have also ordered Zofran  for nausea.  See ED course for further MDM and disposition.  Amount and/or Complexity of Data Reviewed Labs: ordered. Decision-making details documented in ED Course. Radiology: ordered and independent interpretation performed. Decision-making details documented in ED Course. ECG/medicine tests: independent interpretation performed.  Risk Prescription drug management. Decision regarding hospitalization. Diagnosis or treatment significantly limited by social determinants of health. Risk Details: Poor health literacy      Final diagnoses:  SIRS (systemic inflammatory response syndrome) [facility])    ED Discharge Orders     None          Neysa Caron PARAS, DO 01/12/24 2346

## 2024-01-12 NOTE — ED Notes (Addendum)
Pt attempted but was unable to provide a urine sample at this time.

## 2024-01-12 NOTE — ED Notes (Signed)
 ED Provider at bedside.

## 2024-01-12 NOTE — ED Notes (Signed)
 Patient transported to CT

## 2024-01-12 NOTE — ED Triage Notes (Signed)
 Lower abdominal pain Started today. Reports not voiding much today

## 2024-01-12 NOTE — Progress Notes (Signed)
 ED Pharmacy Antibiotic Sign Off An antibiotic consult was received from an ED provider for Vancomycin  per pharmacy dosing for sepsis. A chart review was completed to assess appropriateness.   The following one time order(s) were placed:  Vancomycin 2000 mg IV  Further antibiotic and/or antibiotic pharmacy consults should be ordered by the admitting provider if indicated.   [name], [facility]  Clinical Pharmacist 01/12/24 11:18 PM

## 2024-01-12 NOTE — ED Notes (Addendum)
 Blood cultures drawn x2 before starting antibiotics.

## 2024-01-13 ENCOUNTER — Encounter (HOSPITAL_COMMUNITY): Payer: Self-pay | Admitting: Internal Medicine

## 2024-01-13 DIAGNOSIS — E662 Morbid (severe) obesity with alveolar hypoventilation: Secondary | ICD-10-CM

## 2024-01-13 DIAGNOSIS — Z79899 Other long term (current) drug therapy: Secondary | ICD-10-CM | POA: Diagnosis not present

## 2024-01-13 DIAGNOSIS — R680 Hypothermia, not associated with low environmental temperature: Secondary | ICD-10-CM | POA: Diagnosis not present

## 2024-01-13 DIAGNOSIS — R112 Nausea with vomiting, unspecified: Secondary | ICD-10-CM

## 2024-01-13 DIAGNOSIS — R6510 Systemic inflammatory response syndrome (SIRS) of non-infectious origin without acute organ dysfunction: Secondary | ICD-10-CM | POA: Diagnosis not present

## 2024-01-13 DIAGNOSIS — Z833 Family history of diabetes mellitus: Secondary | ICD-10-CM | POA: Diagnosis not present

## 2024-01-13 DIAGNOSIS — R109 Unspecified abdominal pain: Secondary | ICD-10-CM | POA: Diagnosis not present

## 2024-01-13 DIAGNOSIS — Z8249 Family history of ischemic heart disease and other diseases of the circulatory system: Secondary | ICD-10-CM | POA: Diagnosis not present

## 2024-01-13 DIAGNOSIS — E872 Acidosis, unspecified: Secondary | ICD-10-CM | POA: Diagnosis not present

## 2024-01-13 DIAGNOSIS — Z6841 Body Mass Index (BMI) 40.0 and over, adult: Secondary | ICD-10-CM | POA: Diagnosis not present

## 2024-01-13 DIAGNOSIS — D72829 Elevated white blood cell count, unspecified: Secondary | ICD-10-CM | POA: Diagnosis not present

## 2024-01-13 DIAGNOSIS — E669 Obesity, unspecified: Secondary | ICD-10-CM | POA: Insufficient documentation

## 2024-01-13 DIAGNOSIS — Z841 Family history of disorders of kidney and ureter: Secondary | ICD-10-CM | POA: Diagnosis not present

## 2024-01-13 DIAGNOSIS — A059 Bacterial foodborne intoxication, unspecified: Secondary | ICD-10-CM | POA: Diagnosis not present

## 2024-01-13 LAB — BASIC METABOLIC PANEL WITH GFR
Anion gap: 12 (ref 5–15)
BUN: 7 mg/dL (ref 6–20)
CO2: 22 mmol/L (ref 22–32)
Calcium: 8.9 mg/dL (ref 8.9–10.3)
Chloride: 103 mmol/L (ref 98–111)
Creatinine, Ser: 0.73 mg/dL (ref 0.44–1.00)
GFR, Estimated: >60 mL/min (ref >60)
Glucose, Bld: 113 mg/dL — ABNORMAL HIGH (ref 70–99)
Potassium: 3.8 mmol/L (ref 3.5–5.1)
Sodium: 137 mmol/L (ref 135–145)

## 2024-01-13 LAB — HEPATIC FUNCTION PANEL
ALT: 20 U/L (ref 0–44)
AST: 18 U/L (ref 15–41)
Albumin: 3.3 g/dL — ABNORMAL LOW (ref 3.5–5.0)
Alkaline Phosphatase: 60 U/L (ref 38–126)
Bilirubin, Direct: 0.2 mg/dL (ref 0.0–0.2)
Indirect Bilirubin: 0.9 mg/dL (ref 0.3–0.9)
Total Bilirubin: 1.1 mg/dL (ref 0.0–1.2)
Total Protein: 7.2 g/dL (ref 6.5–8.1)

## 2024-01-13 LAB — CBC WITH DIFFERENTIAL/PLATELET
Abs Immature Granulocytes: 0.17 10*3/uL — ABNORMAL HIGH (ref 0.00–0.07)
Basophils Absolute: 0 10*3/uL (ref 0.0–0.1)
Basophils Relative: 0 %
Eosinophils Absolute: 0 10*3/uL (ref 0.0–0.5)
Eosinophils Relative: 0 %
HCT: 41 % (ref 36.0–46.0)
Hemoglobin: 13.4 g/dL (ref 12.0–15.0)
Immature Granulocytes: 1 %
Lymphocytes Relative: 8 %
Lymphs Abs: 1.8 10*3/uL (ref 0.7–4.0)
MCH: 26.7 pg (ref 26.0–34.0)
MCHC: 32.7 g/dL (ref 30.0–36.0)
MCV: 81.7 fL (ref 80.0–100.0)
Monocytes Absolute: 1.3 10*3/uL — ABNORMAL HIGH (ref 0.1–1.0)
Monocytes Relative: 6 %
Neutro Abs: 19.8 10*3/uL — ABNORMAL HIGH (ref 1.7–7.7)
Neutrophils Relative %: 85 %
Platelets: 230 10*3/uL (ref 150–400)
RBC: 5.02 MIL/uL (ref 3.87–5.11)
RDW: 14.1 % (ref 11.5–15.5)
WBC: 23.1 10*3/uL — ABNORMAL HIGH (ref 4.0–10.5)
nRBC: 0 % (ref 0.0–0.2)

## 2024-01-13 LAB — HIV ANTIBODY (ROUTINE TESTING W REFLEX): HIV Screen 4th Generation wRfx: NONREACTIVE

## 2024-01-13 MED ORDER — METRONIDAZOLE 500 MG/100ML IV SOLN
500.0000 mg | Freq: Two times a day (BID) | INTRAVENOUS | Status: DC
  Administered 2024-01-13 – 2024-01-15 (×5): 500 mg via INTRAVENOUS
  Filled 2024-01-13 (×5): qty 100

## 2024-01-13 MED ORDER — ACETAMINOPHEN 325 MG PO TABS: 650.0000 mg | ORAL_TABLET | Freq: Four times a day (QID) | ORAL | Status: DC | PRN

## 2024-01-13 MED ORDER — LACTATED RINGERS IV SOLN: INTRAVENOUS | Status: AC

## 2024-01-13 MED ORDER — VANCOMYCIN HCL 1250 MG/250ML IV SOLN
1250.0000 mg | Freq: Three times a day (TID) | INTRAVENOUS | Status: DC
  Administered 2024-01-13: 1250 mg via INTRAVENOUS
  Filled 2024-01-13 (×3): qty 250

## 2024-01-13 MED ORDER — PROCHLORPERAZINE EDISYLATE 10 MG/2ML IJ SOLN: 5.0000 mg | Freq: Four times a day (QID) | INTRAMUSCULAR | Status: DC | PRN

## 2024-01-13 MED ORDER — ENOXAPARIN SODIUM 60 MG/0.6ML IJ SOSY
60.0000 mg | PREFILLED_SYRINGE | Freq: Every day | INTRAMUSCULAR | Status: DC
  Administered 2024-01-14 – 2024-01-15 (×2): 60 mg via SUBCUTANEOUS
  Filled 2024-01-13 (×2): qty 0.6

## 2024-01-13 MED ORDER — POLYETHYLENE GLYCOL 3350 17 G PO PACK
17.0000 g | PACK | Freq: Every day | ORAL | Status: DC | PRN
  Administered 2024-01-14 – 2024-01-15 (×2): 17 g via ORAL
  Filled 2024-01-13 (×2): qty 1

## 2024-01-13 MED ORDER — OXYCODONE HCL 5 MG/5ML PO SOLN
10.0000 mg | ORAL | Status: DC | PRN
  Administered 2024-01-13 – 2024-01-15 (×6): 10 mg via ORAL
  Filled 2024-01-13 (×6): qty 10

## 2024-01-13 MED ORDER — HYDROMORPHONE HCL 1 MG/ML IJ SOLN
0.5000 mg | Freq: Once | INTRAMUSCULAR | Status: AC
  Administered 2024-01-13: 0.5 mg via INTRAVENOUS
  Filled 2024-01-13: qty 1

## 2024-01-13 MED ORDER — SODIUM CHLORIDE 0.9 % IV SOLN
2.0000 g | Freq: Every day | INTRAVENOUS | Status: DC
  Administered 2024-01-13 – 2024-01-15 (×3): 2 g via INTRAVENOUS
  Filled 2024-01-13 (×4): qty 20

## 2024-01-13 MED ORDER — OXYCODONE HCL 5 MG PO TABS
5.0000 mg | ORAL_TABLET | ORAL | Status: DC | PRN
  Filled 2024-01-13: qty 1

## 2024-01-13 MED ORDER — MELATONIN 5 MG PO TABS: 5.0000 mg | ORAL_TABLET | Freq: Every evening | ORAL | Status: DC | PRN

## 2024-01-13 MED ORDER — ENOXAPARIN SODIUM 40 MG/0.4ML IJ SOSY
40.0000 mg | PREFILLED_SYRINGE | Freq: Every day | INTRAMUSCULAR | Status: DC
  Administered 2024-01-13: 40 mg via SUBCUTANEOUS
  Filled 2024-01-13: qty 0.4

## 2024-01-13 NOTE — Progress Notes (Signed)
 Pharmacy Antibiotic Note  Jeanne Harrison is a 23 y.o. female admitted on 01/12/2024 with abdominal pain/sepsis.  Pharmacy has been consulted for Vancomycin dosing.  Vancomycin 1000 mg IV given in ED  Plan: Vancomycin 1250 mg IV q8h  Height: 5' 3 (160 cm) Weight: 120.4 kg (265 lb 6.9 oz) IBW/kg (Calculated) : 52.4  Temp (24hrs), Avg:98.5 F (36.9 C), Min:97.5 F (36.4 C), Max:100.9 F (38.3 C)  Recent Labs  Lab 01/12/24 2010 01/12/24 2134  WBC 13.5*  --   CREATININE 0.79  --   LATICACIDVEN 3.5* 2.9*    Estimated Creatinine Clearance: 139.8 mL/min (by C-G formula based on SCr of 0.79 mg/dL).    No Known Allergies   Jeanne Harrison 01/13/2024 4:32 AM

## 2024-01-13 NOTE — Progress Notes (Signed)
 No charge note  Patient seen and examined this morning, admitted overnight, H&P reviewed and I agree with the assessment and plan.  23 year old female with no significant medical history comes into the hospital with abdominal pain, nausea, vomiting few hours after eating breakfast.  Has been having mainly abdominal discomfort.  No diarrhea.  Due to persistent symptoms presented to the ER, she was found to have leukocytosis, elevated lactic acid and a CT scan of the abdomen and pelvis was fairly unremarkable showing small amount of free fluid in the lower abdomen pelvis, potentially physiologic.  She was placed on antibiotics and admitted to the hospital.  Keidan Aumiller M. Trixie, MD, PhD [facility]  Between 7 am - 7 pm you can contact me via Amion (for emergencies) or Securechat (non urgent matters).  I am not available 7 pm - 7 am, please contact night coverage MD/APP via Amion

## 2024-01-13 NOTE — ED Notes (Addendum)
 [facility] at bedside

## 2024-01-13 NOTE — Plan of Care (Signed)
  Problem: Education: Goal: Knowledge of General Education information will improve Description: Including pain rating scale, medication(s)/side effects and non-pharmacologic comfort measures Outcome: Progressing   Problem: Clinical Measurements: Goal: Respiratory complications will improve Outcome: Progressing   Problem: Clinical Measurements: Goal: Cardiovascular complication will be avoided Outcome: Progressing   Problem: Activity: Goal: Risk for activity intolerance will decrease Outcome: Progressing   Problem: Elimination: Goal: Will not experience complications related to urinary retention Outcome: Progressing   Problem: Safety: Goal: Ability to remain free from injury will improve Outcome: Progressing   Problem: Skin Integrity: Goal: Risk for impaired skin integrity will decrease Outcome: Progressing

## 2024-01-13 NOTE — H&P (Signed)
 History and Physical    [name] FMW:[id number] DOB: 02/15/2001 DOA: 01/12/2024  Patient coming from: Home.  Chief Complaint: Abdominal pain nausea vomiting.  HPI: Jeanne Harrison is a 23 y.o. female with no significant past medical history presents to the ER with complaints of abdominal pain with nausea vomiting.  Patient states she went outside to eat breakfast along with a friend.  3 hours later she started having lower abdominal pain with nausea vomiting.  Pain has been persistent.  Denies any associated diarrhea or any vaginal discharge.  Had some subjective feeling of fever chills.  Due to persistent symptoms patient presents to the ER.  Patient's friend who also ate the same food did not have any symptoms.  ED Course: In the ER patient was tachycardic with labs showing elevated lactic acid of 3.5 WBC of 13.5 CT abdomen pelvis shows mild amount of free fluid in the lower abdomen pelvis.  Could be physiological as per radiologist.  UA is unremarkable.  Patient was empirically placed on antibiotics for possible evolving sepsis.  Wet prep was positive for clue cells.  Review of Systems: As per HPI, rest all negative.   History reviewed. No pertinent past medical history.  Past Surgical History:  Procedure Laterality Date   FOOT SURGERY       reports that she is a non-smoker but has been exposed to tobacco smoke. She has never used smokeless tobacco. She reports current drug use. Frequency: 2.00 times per week. Drug: Marijuana. She reports that she does not drink alcohol.  No Known Allergies  Family History  Problem Relation Age of Onset   Hypertension Mother    Hypertension Father    Diabetes Father    Kidney failure Father     Prior to Admission medications   Medication Sig Start Date End Date Taking? Authorizing Provider  doxycycline  (VIBRAMYCIN ) 100 MG capsule Take 1 capsule (100 mg total) by mouth 2 (two) times daily. 04/22/23   Nivia Colon, PA-C  naproxen   (NAPROSYN ) 500 MG tablet Take 1 tablet (500 mg total) by mouth 2 (two) times daily with a meal. 07/22/22   Delbert Clam, MD  valACYclovir  (VALTREX ) 1000 MG tablet At the start of an outbreak, take 1 tablet daily for 5 days. Patient not taking: Reported on 07/22/2022 09/11/20   Christopher Savannah, PA-C    Physical Exam: Constitutional: Moderately built and nourished. Vitals:   01/13/24 0000 01/13/24 0015 01/13/24 0124 01/13/24 0140  BP: 133/68 113/71 (!) 140/71   Pulse: (!) 110 (!) 113 (!) 118   Resp: (!) 27 (!) 28 18   Temp:   98.6 F (37 C)   TempSrc:      SpO2: 96% 98% 99%   Weight:    120.4 kg  Height:    5' 3 (1.6 m)   Eyes: Anicteric no pallor. ENMT: No discharge from the ears eyes nose and mouth. Neck: No mass felt.  No neck rigidity. Respiratory: No rhonchi or crepitations. Cardiovascular: S1-S2 heard. Abdomen: Soft nontender bowel sound present. Musculoskeletal: No edema. Skin: No rash. Neurologic: Alert awake oriented time place and person.  Moves all extremities. Psychiatric: Appears normal.  Normal affect.   Labs on Admission: I have personally reviewed following labs and imaging studies  CBC: Recent Labs  Lab 01/12/24 2010  WBC 13.5*  NEUTROABS 11.5*  HGB 14.9  HCT 46.1*  MCV 81.4  PLT 295   Basic Metabolic Panel: Recent Labs  Lab 01/12/24 2010  NA 138  K 3.9  CL 102  CO2 23  GLUCOSE 138*  BUN 10  CREATININE 0.79  CALCIUM 9.9   GFR: Estimated Creatinine Clearance: 139.8 mL/min (by C-G formula based on SCr of 0.79 mg/dL). Liver Function Tests: Recent Labs  Lab 01/12/24 2010  AST 21  ALT 23  ALKPHOS 110  BILITOT 0.6  PROT 8.9*  ALBUMIN 4.6   Recent Labs  Lab 01/12/24 2010  LIPASE 33   No results for input(s): AMMONIA in the last 168 hours. Coagulation Profile: No results for input(s): INR, PROTIME in the last 168 hours. Cardiac Enzymes: No results for input(s): CKTOTAL, CKMB, CKMBINDEX, TROPONINI in the last 168 hours. BNP  (last 3 results) No results for input(s): PROBNP in the last 8760 hours. HbA1C: No results for input(s): HGBA1C in the last 72 hours. CBG: No results for input(s): GLUCAP in the last 168 hours. Lipid Profile: No results for input(s): CHOL, HDL, LDLCALC, TRIG, CHOLHDL, LDLDIRECT in the last 72 hours. Thyroid Function Tests: No results for input(s): TSH, T4TOTAL, FREET4, T3FREE, THYROIDAB in the last 72 hours. Anemia Panel: No results for input(s): VITAMINB12, FOLATE, FERRITIN, TIBC, IRON, RETICCTPCT in the last 72 hours. Urine analysis:    Component Value Date/Time   COLORURINE COLORLESS (A) 01/12/2024 2010   APPEARANCEUR CLEAR 01/12/2024 2010   APPEARANCEUR Hazy 03/08/2013 1747   LABSPEC >1.046 (H) 01/12/2024 2010   LABSPEC 1.019 03/08/2013 1747   PHURINE 7.5 01/12/2024 2010   GLUCOSEU NEGATIVE 01/12/2024 2010   GLUCOSEU Negative 03/08/2013 1747   HGBUR NEGATIVE 01/12/2024 2010   BILIRUBINUR NEGATIVE 01/12/2024 2010   BILIRUBINUR negative 09/11/2020 1930   BILIRUBINUR Negative 03/08/2013 1747   KETONESUR NEGATIVE 01/12/2024 2010   PROTEINUR TRACE (A) 01/12/2024 2010   UROBILINOGEN 2.0 (A) 09/11/2020 1930   UROBILINOGEN 0.2 02/14/2020 1610   NITRITE NEGATIVE 01/12/2024 2010   LEUKOCYTESUR NEGATIVE 01/12/2024 2010   LEUKOCYTESUR 2+ 03/08/2013 1747   Sepsis Labs: @LABRCNTIP (procalcitonin:4,lacticidven:4) ) Recent Results (from the past 240 hours)  Resp panel by RT-PCR (RSV, Flu A&B, Covid) Anterior Nasal Swab     Status: None   Collection Time: 01/12/24 10:40 PM   Specimen: Anterior Nasal Swab  Result Value Ref Range Status   SARS Coronavirus 2 by RT PCR NEGATIVE NEGATIVE Final    Comment: (NOTE) SARS-CoV-2 target nucleic acids are NOT DETECTED.  The SARS-CoV-2 RNA is generally detectable in upper respiratory specimens during the acute phase of infection. The lowest concentration of SARS-CoV-2 viral copies this assay can detect  is 138 copies/mL. A negative result does not preclude SARS-Cov-2 infection and should not be used as the sole basis for treatment or other patient management decisions. A negative result may occur with  improper specimen collection/handling, submission of specimen other than nasopharyngeal swab, presence of viral mutation(s) within the areas targeted by this assay, and inadequate number of viral copies(<138 copies/mL). A negative result must be combined with clinical observations, patient history, and epidemiological information. The expected result is Negative.  Fact Sheet for Patients:  BloggerCourse.com  Fact Sheet for Healthcare Providers:  SeriousBroker.it  This test is no t yet approved or cleared by the [place]  FDA and  has been authorized for detection and/or diagnosis of SARS-CoV-2 by FDA under an Emergency Use Authorization (EUA). This EUA will remain  in effect (meaning this test can be used) for the duration of the COVID-19 declaration under Section 564(b)(1) of the Act, 21 U.S.C.section 360bbb-3(b)(1), unless the authorization is terminated  or revoked sooner.  Influenza A by PCR NEGATIVE NEGATIVE Final   Influenza B by PCR NEGATIVE NEGATIVE Final    Comment: (NOTE) The Xpert Xpress SARS-CoV-2/FLU/RSV plus assay is intended as an aid in the diagnosis of influenza from Nasopharyngeal swab specimens and should not be used as a sole basis for treatment. Nasal washings and aspirates are unacceptable for Xpert Xpress SARS-CoV-2/FLU/RSV testing.  Fact Sheet for Patients: BloggerCourse.com  Fact Sheet for Healthcare Providers: SeriousBroker.it  This test is not yet approved or cleared by the [place]  FDA and has been authorized for detection and/or diagnosis of SARS-CoV-2 by FDA under an Emergency Use Authorization (EUA). This EUA will remain in effect  (meaning this test can be used) for the duration of the COVID-19 declaration under Section 564(b)(1) of the Act, 21 U.S.C. section 360bbb-3(b)(1), unless the authorization is terminated or revoked.     Resp Syncytial Virus by PCR NEGATIVE NEGATIVE Final    Comment: (NOTE) Fact Sheet for Patients: BloggerCourse.com  Fact Sheet for Healthcare Providers: SeriousBroker.it  This test is not yet approved or cleared by the [place]  FDA and has been authorized for detection and/or diagnosis of SARS-CoV-2 by FDA under an Emergency Use Authorization (EUA). This EUA will remain in effect (meaning this test can be used) for the duration of the COVID-19 declaration under Section 564(b)(1) of the Act, 21 U.S.C. section 360bbb-3(b)(1), unless the authorization is terminated or revoked.  Performed at [facility], [address], [place], [place] [address]   Wet prep, genital     Status: Abnormal   Collection Time: 01/12/24 11:45 PM   Specimen: Vaginal  Result Value Ref Range Status   Yeast Wet Prep HPF POC NONE SEEN NONE SEEN Final   Trich, Wet Prep NONE SEEN NONE SEEN Final   Clue Cells Wet Prep HPF POC PRESENT (A) NONE SEEN Final   WBC, Wet Prep HPF POC <10 <10 Final   Sperm NONE SEEN  Final    Comment: Performed at [facility], [address] [place], [place] [address]     Radiological Exams on Admission: DG Chest Portable 1 View Result Date: 01/12/2024 CLINICAL DATA:  Lower abdominal pain. EXAM: PORTABLE CHEST 1 VIEW COMPARISON:  April 19, 2022 FINDINGS: The heart size and mediastinal contours are within normal limits. Both lungs are clear. The visualized skeletal structures are unremarkable. IMPRESSION: No active disease. Electronically Signed   By: Suzen Dials M.D.   On: 01/12/2024 23:52   CT ABDOMEN PELVIS W CONTRAST Result Date: 01/12/2024 CLINICAL DATA:  Right lower quadrant pain.  EXAM: CT ABDOMEN AND PELVIS WITH CONTRAST TECHNIQUE: Multidetector CT imaging of the abdomen and pelvis was performed using the standard protocol following bolus administration of intravenous contrast. RADIATION DOSE REDUCTION: This exam was performed according to the departmental dose-optimization program which includes automated exposure control, adjustment of the mA and/or kV according to patient size and/or use of iterative reconstruction technique. CONTRAST:  100mL OMNIPAQUE IOHEXOL 300 MG/ML  SOLN COMPARISON:  None Available. FINDINGS: Lower chest: No acute abnormality. Hepatobiliary: No focal liver abnormality is seen. No gallstones, gallbladder wall thickening, or biliary dilatation. Pancreas: Unremarkable. No pancreatic ductal dilatation or surrounding inflammatory changes. Spleen: Normal in size without focal abnormality. Adrenals/Urinary Tract: Adrenal glands are unremarkable. Kidneys are normal, without renal calculi, focal lesion, or hydronephrosis. Bladder is unremarkable. Stomach/Bowel: Stomach is within normal limits. Appendix appears normal. No evidence of bowel wall thickening, distention, or inflammatory changes. Vascular/Lymphatic: No significant vascular findings are present. No enlarged  abdominal or pelvic lymph nodes. Reproductive: Uterus and bilateral adnexa are unremarkable. Other: No abdominal wall hernia or abnormality. A mild amount of free fluid is seen within the lower abdomen and pelvis. Musculoskeletal: No acute or significant osseous findings. IMPRESSION: 1. Mild amount of free fluid within the lower abdomen and pelvis likely, in part, physiologic. 2. Normal appendix. Electronically Signed   By: Suzen Dials M.D.   On: 01/12/2024 21:40      Assessment/Plan Principal Problem:   SIRS (systemic inflammatory response syndrome) (HCC) Active Problems:   Sepsis (HCC)    SIRS with nausea vomiting abdominal pain -     cause not clear could be related to possible food  poisoning.  Clue cells are positive in the wet prep.  But patient denies any vaginal discharge.  Follow cultures.  Continue hydration will continue with empiric antibiotics and Flagyl  since clue cells are positive. Morbid obesity will consult nutritionist.  Since patient has concerning features for developing sepsis will need close monitoring and more than 2 midnight stay.   DVT prophylaxis: Lovenox. Code Status: Full code. Family Communication: Patient's mom at the bedside. Disposition Plan: Medical floor. Consults called: None. Admission status: Inpatient.

## 2024-01-14 DIAGNOSIS — R6510 Systemic inflammatory response syndrome (SIRS) of non-infectious origin without acute organ dysfunction: Secondary | ICD-10-CM | POA: Diagnosis not present

## 2024-01-14 LAB — GC/CHLAMYDIA PROBE AMP (~~LOC~~) NOT AT ARMC
Chlamydia: NEGATIVE
Comment: NEGATIVE
Comment: NORMAL
Neisseria Gonorrhea: NEGATIVE

## 2024-01-14 LAB — CBC
HCT: 45.7 % (ref 36.0–46.0)
Hemoglobin: 14.7 g/dL (ref 12.0–15.0)
MCH: 27.1 pg (ref 26.0–34.0)
MCHC: 32.2 g/dL (ref 30.0–36.0)
MCV: 84.2 fL (ref 80.0–100.0)
Platelets: 198 10*3/uL (ref 150–400)
RBC: 5.43 MIL/uL — ABNORMAL HIGH (ref 3.87–5.11)
RDW: 14.4 % (ref 11.5–15.5)
WBC: 15.8 10*3/uL — ABNORMAL HIGH (ref 4.0–10.5)
nRBC: 0 % (ref 0.0–0.2)

## 2024-01-14 LAB — COMPREHENSIVE METABOLIC PANEL WITH GFR
ALT: 19 U/L (ref 0–44)
AST: 15 U/L (ref 15–41)
Albumin: 2.9 g/dL — ABNORMAL LOW (ref 3.5–5.0)
Alkaline Phosphatase: 56 U/L (ref 38–126)
Anion gap: 8 (ref 5–15)
BUN: 5 mg/dL — ABNORMAL LOW (ref 6–20)
CO2: 24 mmol/L (ref 22–32)
Calcium: 8.6 mg/dL — ABNORMAL LOW (ref 8.9–10.3)
Chloride: 103 mmol/L (ref 98–111)
Creatinine, Ser: 0.87 mg/dL (ref 0.44–1.00)
GFR, Estimated: >60 mL/min (ref >60)
Glucose, Bld: 97 mg/dL (ref 70–99)
Potassium: 3.4 mmol/L — ABNORMAL LOW (ref 3.5–5.1)
Sodium: 135 mmol/L (ref 135–145)
Total Bilirubin: 1.1 mg/dL (ref 0.0–1.2)
Total Protein: 7 g/dL (ref 6.5–8.1)

## 2024-01-14 LAB — MAGNESIUM: Magnesium: 1.9 mg/dL (ref 1.7–2.4)

## 2024-01-14 MED ORDER — SENNOSIDES-DOCUSATE SODIUM 8.6-50 MG PO TABS
2.0000 | ORAL_TABLET | Freq: Two times a day (BID) | ORAL | Status: DC
  Administered 2024-01-14: 2 via ORAL
  Filled 2024-01-14 (×3): qty 2

## 2024-01-14 MED ORDER — POTASSIUM CHLORIDE 20 MEQ PO PACK
40.0000 meq | PACK | Freq: Once | ORAL | Status: AC
  Administered 2024-01-14: 40 meq via ORAL
  Filled 2024-01-14: qty 2

## 2024-01-14 MED ORDER — GLYCERIN (LAXATIVE) 2 G RE SUPP
1.0000 | Freq: Once | RECTAL | Status: AC
  Administered 2024-01-14: 1 via RECTAL
  Filled 2024-01-14 (×2): qty 1

## 2024-01-14 NOTE — Plan of Care (Signed)

## 2024-01-14 NOTE — Progress Notes (Signed)
 PROGRESS NOTE  Jeanne Harrison FMW:[id number] DOB: 2000/10/11 DOA: 01/12/2024 PCP: Delbert Clam, MD   LOS: 1 day   Brief Narrative / Interim history: 23 year old female with no significant medical history comes into the hospital with abdominal pain, nausea, vomiting few hours after eating breakfast. Has been having mainly abdominal discomfort. No diarrhea. Due to persistent symptoms presented to the ER, she was found to have leukocytosis, elevated lactic acid and a CT scan of the abdomen and pelvis was fairly unremarkable showing small amount of free fluid in the lower abdomen pelvis, potentially physiologic. She was placed on antibiotics and admitted to the hospital.   Subjective / 24h Interval events: Remains with abdominal discomfort, has been on full liquid diet and has not had any nausea or vomiting  Assesement and Plan: Principal Problem:   SIRS (systemic inflammatory response syndrome) (HCC) Active Problems:   Sepsis (HCC)   Obesity (BMI 30-39.9)   Principal problem SIRS, nausea, vomiting, abdominal pain-possible food poisoning.  Continue antibiotics, advance diet to soft today.  Continue pain control  Active problems Morbid obesity-BMI 47.  She would benefit from weight loss  Disposition-if she tolerating diet advancement, potentially home tomorrow  Scheduled Meds:  enoxaparin (LOVENOX) injection  60 mg Subcutaneous Daily   Glycerin (Adult)  1 suppository Rectal Once   potassium chloride  40 mEq Oral Once   senna-docusate  2 tablet Oral BID   Continuous Infusions:  cefTRIAXone  (ROCEPHIN )  IV Stopped (01/13/24 0630)   metronidazole  Stopped (01/14/24 0000)   PRN Meds:.acetaminophen , melatonin, oxyCODONE, polyethylene glycol, prochlorperazine  Current Outpatient Medications  Medication Instructions   valACYclovir  (VALTREX ) 1000 MG tablet At the start of an outbreak, take 1 tablet daily for 5 days.    Diet Orders (From admission, onward)     Start     Ordered    01/14/24 0854  DIET SOFT Room service appropriate? Yes; Fluid consistency: Thin  Diet effective now       Question Answer Comment  Room service appropriate? Yes   Fluid consistency: Thin      01/14/24 0853            DVT prophylaxis:    Lab Results  Component Value Date   PLT 198 01/14/2024      Code Status: Full Code  Family Communication: Significant other at bedside  Status is: Inpatient Remains inpatient appropriate because: Persistent symptoms   Level of care: Med-Surg  Consultants:  None  Objective: Vitals:   01/13/24 2023 01/14/24 0018 01/14/24 0503 01/14/24 0847  BP: 128/73 127/83 134/88 123/80  Pulse: (!) 102 (!) 104 98 (!) 104  Resp: 20  19   Temp: 98.3 F (36.8 C) 98.3 F (36.8 C) 98.2 F (36.8 C) 98.2 F (36.8 C)  TempSrc: Oral Oral Oral   SpO2: 100% 100% 100% 100%  Weight:      Height:        Intake/Output Summary (Last 24 hours) at 01/14/2024 1028 Last data filed at 01/14/2024 0300 Gross per 24 hour  Intake 1200.64 ml  Output --  Net 1200.64 ml   Wt Readings from Last 3 Encounters:  01/13/24 120.4 kg  04/22/23 114.8 kg  07/22/22 123.8 kg    Examination:  Constitutional: NAD Eyes: no scleral icterus ENMT: Mucous membranes are moist.  Neck: normal, supple Respiratory: clear to auscultation bilaterally, no wheezing, no crackles.  Cardiovascular: Regular rate and rhythm, no murmurs / rubs / gallops. No LE edema.  Abdomen: non distended, no tenderness. Bowel  sounds positive.  Musculoskeletal: no clubbing / cyanosis.   Data Reviewed: I have independently reviewed following labs and imaging studies   CBC Recent Labs  Lab 01/12/24 2010 01/13/24 0417 01/14/24 0230  WBC 13.5* 23.1* 15.8*  HGB 14.9 13.4 14.7  HCT 46.1* 41.0 45.7  PLT 295 230 198  MCV 81.4 81.7 84.2  MCH 26.3 26.7 27.1  MCHC 32.3 32.7 32.2  RDW 14.0 14.1 14.4  LYMPHSABS 1.2 1.8  --   MONOABS 0.7 1.3*  --   EOSABS 0.0 0.0  --   BASOSABS 0.0 0.0  --      Recent Labs  Lab 01/12/24 2010 01/12/24 2134 01/13/24 0417 01/14/24 0907  NA 138  --  137 135  K 3.9  --  3.8 3.4*  CL 102  --  103 103  CO2 23  --  22 24  GLUCOSE 138*  --  113* 97  BUN 10  --  7 5*  CREATININE 0.79  --  0.73 0.87  CALCIUM 9.9  --  8.9 8.6*  AST 21  --  18 15  ALT 23  --  20 19  ALKPHOS 110  --  60 56  BILITOT 0.6  --  1.1 1.1  ALBUMIN 4.6  --  3.3* 2.9*  MG  --   --   --  1.9  LATICACIDVEN 3.5* 2.9*  --   --     ------------------------------------------------------------------------------------------------------------------ No results for input(s): CHOL, HDL, LDLCALC, TRIG, CHOLHDL, LDLDIRECT in the last 72 hours.  No results found for: HGBA1C ------------------------------------------------------------------------------------------------------------------ No results for input(s): TSH, T4TOTAL, T3FREE, THYROIDAB in the last 72 hours.  Invalid input(s): FREET3  Cardiac Enzymes No results for input(s): CKMB, TROPONINI, MYOGLOBIN in the last 168 hours.  Invalid input(s): CK ------------------------------------------------------------------------------------------------------------------ No results found for: BNP  CBG: No results for input(s): GLUCAP in the last 168 hours.  Recent Results (from the past 240 hours)  Culture, blood (routine x 2)     Status: None (Preliminary result)   Collection Time: 01/12/24  8:10 PM   Specimen: BLOOD  Result Value Ref Range Status   Specimen Description   Final    BLOOD BLOOD RIGHT ARM Performed at [facility], [address], [place], [place] [address]    Special Requests   Final    BOTTLES DRAWN AEROBIC AND ANAEROBIC Blood Culture adequate volume Performed at [facility], [address], [place], [place] [address]    Culture   Final    NO GROWTH < 24 HOURS Performed at [facility], [address] [place], [place]  [address]    Report Status PENDING  Incomplete  Culture, blood (routine x 2)     Status: None (Preliminary result)   Collection Time: 01/12/24  8:35 PM   Specimen: BLOOD  Result Value Ref Range Status   Specimen Description   Final    BLOOD LEFT ANTECUBITAL Performed at [facility], [address] [place], [place] [address]    Special Requests   Final    BOTTLES DRAWN AEROBIC AND ANAEROBIC Blood Culture adequate volume Performed at [facility], [address] [place], [place] [address]    Culture   Final    NO GROWTH < 24 HOURS Performed at [facility], [address] [place], [place] [address]    Report Status PENDING  Incomplete  Resp panel by RT-PCR (RSV, Flu A&B, Covid) Anterior Nasal Swab  Status: None   Collection Time: 01/12/24 10:40 PM   Specimen: Anterior Nasal Swab  Result Value Ref Range Status   SARS Coronavirus 2 by RT PCR NEGATIVE NEGATIVE Final    Comment: (NOTE) SARS-CoV-2 target nucleic acids are NOT DETECTED.  The SARS-CoV-2 RNA is generally detectable in upper respiratory specimens during the acute phase of infection. The lowest concentration of SARS-CoV-2 viral copies this assay can detect is 138 copies/mL. A negative result does not preclude SARS-Cov-2 infection and should not be used as the sole basis for treatment or other patient management decisions. A negative result may occur with  improper specimen collection/handling, submission of specimen other than nasopharyngeal swab, presence of viral mutation(s) within the areas targeted by this assay, and inadequate number of viral copies(<138 copies/mL). A negative result must be combined with clinical observations, patient history, and epidemiological information. The expected result is Negative.  Fact Sheet for Patients:  BloggerCourse.com  Fact Sheet for Healthcare Providers:  SeriousBroker.it  This  test is no t yet approved or cleared by the [place]  FDA and  has been authorized for detection and/or diagnosis of SARS-CoV-2 by FDA under an Emergency Use Authorization (EUA). This EUA will remain  in effect (meaning this test can be used) for the duration of the COVID-19 declaration under Section 564(b)(1) of the Act, 21 U.S.C.section 360bbb-3(b)(1), unless the authorization is terminated  or revoked sooner.       Influenza A by PCR NEGATIVE NEGATIVE Final   Influenza B by PCR NEGATIVE NEGATIVE Final    Comment: (NOTE) The Xpert Xpress SARS-CoV-2/FLU/RSV plus assay is intended as an aid in the diagnosis of influenza from Nasopharyngeal swab specimens and should not be used as a sole basis for treatment. Nasal washings and aspirates are unacceptable for Xpert Xpress SARS-CoV-2/FLU/RSV testing.  Fact Sheet for Patients: BloggerCourse.com  Fact Sheet for Healthcare Providers: SeriousBroker.it  This test is not yet approved or cleared by the [place]  FDA and has been authorized for detection and/or diagnosis of SARS-CoV-2 by FDA under an Emergency Use Authorization (EUA). This EUA will remain in effect (meaning this test can be used) for the duration of the COVID-19 declaration under Section 564(b)(1) of the Act, 21 U.S.C. section 360bbb-3(b)(1), unless the authorization is terminated or revoked.     Resp Syncytial Virus by PCR NEGATIVE NEGATIVE Final    Comment: (NOTE) Fact Sheet for Patients: BloggerCourse.com  Fact Sheet for Healthcare Providers: SeriousBroker.it  This test is not yet approved or cleared by the [place]  FDA and has been authorized for detection and/or diagnosis of SARS-CoV-2 by FDA under an Emergency Use Authorization (EUA). This EUA will remain in effect (meaning this test can be used) for the duration of the COVID-19 declaration under  Section 564(b)(1) of the Act, 21 U.S.C. section 360bbb-3(b)(1), unless the authorization is terminated or revoked.  Performed at [facility], [address], [place], [place] [address]   Wet prep, genital     Status: Abnormal   Collection Time: 01/12/24 11:45 PM   Specimen: Vaginal  Result Value Ref Range Status   Yeast Wet Prep HPF POC NONE SEEN NONE SEEN Final   Trich, Wet Prep NONE SEEN NONE SEEN Final   Clue Cells Wet Prep HPF POC PRESENT (A) NONE SEEN Final   WBC, Wet Prep HPF POC <10 <10 Final   Sperm NONE SEEN  Final    Comment: Performed at [facility], [address], [place], [place] [address]  Radiology Studies: No results found.   Nilda Fendt, MD, PhD [facility]  Between 7 am - 7 pm I am available, please contact me via Amion (for emergencies) or Securechat (non urgent messages)  Between 7 pm - 7 am I am not available, please contact night coverage MD/APP via Amion

## 2024-01-15 DIAGNOSIS — R6510 Systemic inflammatory response syndrome (SIRS) of non-infectious origin without acute organ dysfunction: Secondary | ICD-10-CM | POA: Diagnosis not present

## 2024-01-15 MED ORDER — METRONIDAZOLE 500 MG PO TABS: 500.0000 mg | ORAL_TABLET | Freq: Two times a day (BID) | ORAL | 0 refills | Status: DC

## 2024-01-15 MED ORDER — METRONIDAZOLE 500 MG/5ML PO SUSP
500.0000 mg | Freq: Two times a day (BID) | ORAL | 0 refills | Status: AC
Start: 2024-01-15 — End: 2024-01-17

## 2024-01-15 MED ORDER — CIPROFLOXACIN HCL 500 MG PO TABS
500.0000 mg | ORAL_TABLET | Freq: Two times a day (BID) | ORAL | 0 refills | Status: DC
Start: 2024-01-15 — End: 2024-01-15

## 2024-01-15 MED ORDER — AMOXICILLIN-POT CLAVULANATE 400-57 MG/5ML PO SUSR: 875.0000 mg | Freq: Two times a day (BID) | ORAL | 0 refills | Status: AC

## 2024-01-15 MED ORDER — OXYCODONE HCL 5 MG/5ML PO SOLN: 5.0000 mg | Freq: Four times a day (QID) | ORAL | 0 refills | Status: AC | PRN

## 2024-01-15 NOTE — Discharge Summary (Signed)
 Physician Discharge Summary  Jeanne Harrison FMW:[id number] DOB: September 09, 2000 DOA: 01/12/2024  PCP: Delbert Clam, MD  Admit date: 01/12/2024 Discharge date: 01/15/2024  Admitted From: home Disposition:  home  Recommendations for Outpatient Follow-up:  Follow up with PCP in 1-2 weeks  Home Health: none Equipment/Devices: none  Discharge Condition: stable CODE STATUS: Full code Diet Orders (From admission, onward)     Start     Ordered   01/14/24 0854  DIET SOFT Room service appropriate? Yes; Fluid consistency: Thin  Diet effective now       Question Answer Comment  Room service appropriate? Yes   Fluid consistency: Thin      01/14/24 0853            Brief Narrative / Interim history: 23 year old female with no significant medical history comes into the hospital with abdominal pain, nausea, vomiting few hours after eating breakfast. Has been having mainly abdominal discomfort. No diarrhea. Due to persistent symptoms presented to the ER, she was found to have leukocytosis, elevated lactic acid and a CT scan of the abdomen and pelvis was fairly unremarkable showing small amount of free fluid in the lower abdomen pelvis, potentially physiologic. She was placed on antibiotics and admitted to the hospital.   Hospital Course / Discharge diagnoses: Principal Problem:   SIRS (systemic inflammatory response syndrome) (HCC) Active Problems:   Sepsis (HCC)   Obesity (BMI 30-39.9)   Principal problem SIRS, nausea, vomiting, abdominal pain-patient was admitted to the hospital with possible food poisoning, nausea, vomiting, abdominal pain.  CT scan of the abdomen and pelvis without significant acute findings.  Her nausea and vomiting have resolved, abdominal pain improved, diet was advanced and she is now tolerating a regular diet.  States she responded well to antibiotics will convert to ciprofloxacin  and metronidazole  for couple additional days.  She will be discharged home in stable  condition   Active problems Morbid obesity-BMI 47.  She would benefit from weight loss  Sepsis ruled out   Discharge Instructions   Allergies as of 01/15/2024   No Known Allergies      Medication List     TAKE these medications    ciprofloxacin  500 MG tablet Commonly known as: Cipro  Take 1 tablet (500 mg total) by mouth 2 (two) times daily for 2 days.   metroNIDAZOLE  500 MG tablet Commonly known as: FLAGYL  Take 1 tablet (500 mg total) by mouth 2 (two) times daily for 2 days.   oxyCODONE  5 MG/5ML solution Commonly known as: ROXICODONE  Take 5 mLs (5 mg total) by mouth every 6 (six) hours as needed for up to 5 days for severe pain (pain score 7-10) or breakthrough pain.   valACYclovir  1000 MG tablet Commonly known as: VALTREX  At the start of an outbreak, take 1 tablet daily for 5 days.       Consultations: none  Procedures/Studies:  DG Chest Portable 1 View Result Date: 01/12/2024 CLINICAL DATA:  Lower abdominal pain. EXAM: PORTABLE CHEST 1 VIEW COMPARISON:  April 19, 2022 FINDINGS: The heart size and mediastinal contours are within normal limits. Both lungs are clear. The visualized skeletal structures are unremarkable. IMPRESSION: No active disease. Electronically Signed   By: Suzen Dials M.D.   On: 01/12/2024 23:52   CT ABDOMEN PELVIS W CONTRAST Result Date: 01/12/2024 CLINICAL DATA:  Right lower quadrant pain. EXAM: CT ABDOMEN AND PELVIS WITH CONTRAST TECHNIQUE: Multidetector CT imaging of the abdomen and pelvis was performed using the standard protocol following bolus administration  of intravenous contrast. RADIATION DOSE REDUCTION: This exam was performed according to the departmental dose-optimization program which includes automated exposure control, adjustment of the mA and/or kV according to patient size and/or use of iterative reconstruction technique. CONTRAST:  OMNIPAQUE  IOHEXOL  300 MG/ML  SOLN COMPARISON:  None Available. FINDINGS: Lower chest: No  acute abnormality. Hepatobiliary: No focal liver abnormality is seen. No gallstones, gallbladder wall thickening, or biliary dilatation. Pancreas: Unremarkable. No pancreatic ductal dilatation or surrounding inflammatory changes. Spleen: Normal in size without focal abnormality. Adrenals/Urinary Tract: Adrenal glands are unremarkable. Kidneys are normal, without renal calculi, focal lesion, or hydronephrosis. Bladder is unremarkable. Stomach/Bowel: Stomach is within normal limits. Appendix appears normal. No evidence of bowel wall thickening, distention, or inflammatory changes. Vascular/Lymphatic: No significant vascular findings are present. No enlarged abdominal or pelvic lymph nodes. Reproductive: Uterus and bilateral adnexa are unremarkable. Other: No abdominal wall hernia or abnormality. A mild amount of free fluid is seen within the lower abdomen and pelvis. Musculoskeletal: No acute or significant osseous findings. IMPRESSION: 1. Mild amount of free fluid within the lower abdomen and pelvis likely, in part, physiologic. 2. Normal appendix. Electronically Signed   By: Suzen Dials M.D.   On: 01/12/2024 21:40     Subjective: - no chest pain, shortness of breath, no abdominal pain, nausea or vomiting.   Discharge Exam: BP 117/71 (BP Location: Right Arm)   Pulse (!) 109   Temp 98.4 F (36.9 C)   Resp 18   Ht 5' 3 (1.6 m)   Wt 120.4 kg   SpO2 100%   BMI 47.02 kg/m   General: Pt is alert, awake, not in acute distress Cardiovascular: RRR, S1/S2 +, no rubs, no gallops Respiratory: CTA bilaterally, no wheezing, no rhonchi Abdominal: Soft, NT, ND, bowel sounds + Extremities: no edema, no cyanosis    The results of significant diagnostics from this hospitalization (including imaging, microbiology, ancillary and laboratory) are listed below for reference.     Microbiology: Recent Results (from the past 240 hours)  Culture, blood (routine x 2)     Status: None (Preliminary result)    Collection Time: 01/12/24  8:10 PM   Specimen: BLOOD  Result Value Ref Range Status   Specimen Description   Final    BLOOD BLOOD RIGHT ARM Performed at [facility], [address], [place], [place] [address]    Special Requests   Final    BOTTLES DRAWN AEROBIC AND ANAEROBIC Blood Culture adequate volume Performed at [facility], [address] [place], [place] [address]    Culture   Final    NO GROWTH 2 DAYS Performed at [facility], [address], [place], [place] [address]    Report Status PENDING  Incomplete  Culture, blood (routine x 2)     Status: None (Preliminary result)   Collection Time: 01/12/24  8:35 PM   Specimen: BLOOD  Result Value Ref Range Status   Specimen Description   Final    BLOOD LEFT ANTECUBITAL Performed at [facility], [address], [place], [place] [address]    Special Requests   Final    BOTTLES DRAWN AEROBIC AND ANAEROBIC Blood Culture adequate volume Performed at [facility], [address], [place], [place] [address]    Culture   Final    NO GROWTH 2 DAYS Performed at [facility], [address], [place], [place] [address]    Report Status PENDING  Incomplete  Resp panel  by RT-PCR (RSV, Flu A&B, Covid) Anterior Nasal Swab     Status: None   Collection Time: 01/12/24 10:40 PM   Specimen: Anterior Nasal Swab  Result Value Ref Range Status   SARS Coronavirus 2 by RT PCR NEGATIVE NEGATIVE Final    Comment: (NOTE) SARS-CoV-2 target nucleic acids are NOT DETECTED.  The SARS-CoV-2 RNA is generally detectable in upper respiratory specimens during the acute phase of infection. The lowest concentration of SARS-CoV-2 viral copies this assay can detect is 138 copies/mL. A negative result does not preclude SARS-Cov-2 infection and should not be used as the sole basis for treatment or other patient management decisions. A negative result may occur with   improper specimen collection/handling, submission of specimen other than nasopharyngeal swab, presence of viral mutation(s) within the areas targeted by this assay, and inadequate number of viral copies(<138 copies/mL). A negative result must be combined with clinical observations, patient history, and epidemiological information. The expected result is Negative.  Fact Sheet for Patients:  BloggerCourse.com  Fact Sheet for Healthcare Providers:  SeriousBroker.it  This test is no t yet approved or cleared by the [place]  FDA and  has been authorized for detection and/or diagnosis of SARS-CoV-2 by FDA under an Emergency Use Authorization (EUA). This EUA will remain  in effect (meaning this test can be used) for the duration of the COVID-19 declaration under Section 564(b)(1) of the Act, 21 U.S.C.section 360bbb-3(b)(1), unless the authorization is terminated  or revoked sooner.       Influenza A by PCR NEGATIVE NEGATIVE Final   Influenza B by PCR NEGATIVE NEGATIVE Final    Comment: (NOTE) The Xpert Xpress SARS-CoV-2/FLU/RSV plus assay is intended as an aid in the diagnosis of influenza from Nasopharyngeal swab specimens and should not be used as a sole basis for treatment. Nasal washings and aspirates are unacceptable for Xpert Xpress SARS-CoV-2/FLU/RSV testing.  Fact Sheet for Patients: BloggerCourse.com  Fact Sheet for Healthcare Providers: SeriousBroker.it  This test is not yet approved or cleared by the [place]  FDA and has been authorized for detection and/or diagnosis of SARS-CoV-2 by FDA under an Emergency Use Authorization (EUA). This EUA will remain in effect (meaning this test can be used) for the duration of the COVID-19 declaration under Section 564(b)(1) of the Act, 21 U.S.C. section 360bbb-3(b)(1), unless the authorization is terminated or revoked.      Resp Syncytial Virus by PCR NEGATIVE NEGATIVE Final    Comment: (NOTE) Fact Sheet for Patients: BloggerCourse.com  Fact Sheet for Healthcare Providers: SeriousBroker.it  This test is not yet approved or cleared by the [place]  FDA and has been authorized for detection and/or diagnosis of SARS-CoV-2 by FDA under an Emergency Use Authorization (EUA). This EUA will remain in effect (meaning this test can be used) for the duration of the COVID-19 declaration under Section 564(b)(1) of the Act, 21 U.S.C. section 360bbb-3(b)(1), unless the authorization is terminated or revoked.  Performed at [facility], [address] [place], [place] [address]   Wet prep, genital     Status: Abnormal   Collection Time: 01/12/24 11:45 PM   Specimen: Vaginal  Result Value Ref Range Status   Yeast Wet Prep HPF POC NONE SEEN NONE SEEN Final   Trich, Wet Prep NONE SEEN NONE SEEN Final   Clue Cells Wet Prep HPF POC PRESENT (A) NONE SEEN Final   WBC, Wet Prep HPF POC <10 <10 Final   Sperm NONE SEEN  Final    Comment: Performed  at [facility], [address], [place], [place] [address]     Labs: Basic Metabolic Panel: Recent Labs  Lab 01/12/24 2010 01/13/24 0417 01/14/24 0907  NA 138 137 135  K 3.9 3.8 3.4*  CL 102 103 103  CO2 23 22 24   GLUCOSE 138* 113* 97  BUN 10 7 5*  CREATININE 0.79 0.73 0.87  CALCIUM 9.9 8.9 8.6*  MG  --   --  1.9   Liver Function Tests: Recent Labs  Lab 01/12/24 2010 01/13/24 0417 01/14/24 0907  AST 21 18 15   ALT 23 20 19   ALKPHOS 110 60 56  BILITOT 0.6 1.1 1.1  PROT 8.9* 7.2 7.0  ALBUMIN 4.6 3.3* 2.9*   CBC: Recent Labs  Lab 01/12/24 2010 01/13/24 0417 01/14/24 0230  WBC 13.5* 23.1* 15.8*  NEUTROABS 11.5* 19.8*  --   HGB 14.9 13.4 14.7  HCT 46.1* 41.0 45.7  MCV 81.4 81.7 84.2  PLT 295 230 198   CBG: No results for input(s): GLUCAP in the last  168 hours. Hgb A1c No results for input(s): HGBA1C in the last 72 hours. Lipid Profile No results for input(s): CHOL, HDL, LDLCALC, TRIG, CHOLHDL, LDLDIRECT in the last 72 hours. Thyroid function studies No results for input(s): TSH, T4TOTAL, T3FREE, THYROIDAB in the last 72 hours.  Invalid input(s): FREET3 Urinalysis    Component Value Date/Time   COLORURINE COLORLESS (A) 01/12/2024 2010   APPEARANCEUR CLEAR 01/12/2024 2010   APPEARANCEUR Hazy 03/08/2013 1747   LABSPEC >1.046 (H) 01/12/2024 2010   LABSPEC 1.019 03/08/2013 1747   PHURINE 7.5 01/12/2024 2010   GLUCOSEU NEGATIVE 01/12/2024 2010   GLUCOSEU Negative 03/08/2013 1747   HGBUR NEGATIVE 01/12/2024 2010   BILIRUBINUR NEGATIVE 01/12/2024 2010   BILIRUBINUR negative 09/11/2020 1930   BILIRUBINUR Negative 03/08/2013 1747   KETONESUR NEGATIVE 01/12/2024 2010   PROTEINUR TRACE (A) 01/12/2024 2010   UROBILINOGEN 2.0 (A) 09/11/2020 1930   UROBILINOGEN 0.2 02/14/2020 1610   NITRITE NEGATIVE 01/12/2024 2010   LEUKOCYTESUR NEGATIVE 01/12/2024 2010   LEUKOCYTESUR 2+ 03/08/2013 1747    FURTHER DISCHARGE INSTRUCTIONS:   Get Medicines reviewed and adjusted: Please take all your medications with you for your next visit with your Primary MD   Laboratory/radiological data: Please request your Primary MD to go over all hospital tests and procedure/radiological results at the follow up, please ask your Primary MD to get all Hospital records sent to his/her office.   In some cases, they will be blood work, cultures and biopsy results pending at the time of your discharge. Please request that your primary care M.D. goes through all the records of your hospital data and follows up on these results.   Also Note the following: If you experience worsening of your admission symptoms, develop shortness of breath, life threatening emergency, suicidal or homicidal thoughts you must seek medical attention immediately by  calling 911 or calling your MD immediately  if symptoms less severe.   You must read complete instructions/literature along with all the possible adverse reactions/side effects for all the Medicines you take and that have been prescribed to you. Take any new Medicines after you have completely understood and accpet all the possible adverse reactions/side effects.    Do not drive when taking Pain medications or sleeping medications (Benzodaizepines)   Do not take more than prescribed Pain, Sleep and Anxiety Medications. It is not advisable to combine anxiety,sleep and pain medications without talking with your primary care practitioner   Special  Instructions: If you have smoked or chewed Tobacco  in the last 2 yrs please stop smoking, stop any regular Alcohol  and or any Recreational drug use.   Wear Seat belts while driving.   Please note: You were cared for by a hospitalist during your hospital stay. Once you are discharged, your primary care physician will handle any further medical issues. Please note that NO REFILLS for any discharge medications will be authorized once you are discharged, as it is imperative that you return to your primary care physician (or establish a relationship with a primary care physician if you do not have one) for your post hospital discharge needs so that they can reassess your need for medications and monitor your lab values.  Time coordinating discharge: 35 minutes  SIGNED:  Nilda Fendt, MD, PhD 01/15/2024, 8:37 AM

## 2024-01-15 NOTE — TOC Transition Note (Signed)
 Transition of Care [facility]) - Discharge Note   Patient Details  Name: Jeanne Harrison MRN: [id number] Date of Birth: 2000/10/26  Transition [facility]) CM/SW Contact:  Andrez JULIANNA George, RN Phone Number: 01/15/2024, 8:46 AM   Clinical Narrative:     Pt is discharging home with self care. No needs per TOC.  Final next level of care: Home/Self Care Barriers to Discharge: No Barriers Identified   Patient Goals and CMS Choice            Discharge Placement                       Discharge Plan and Services Additional resources added to the After Visit Summary for                                       Social Drivers of Health (SDOH) Interventions SDOH Screenings   Food Insecurity: No Food Insecurity (01/13/2024)  Recent Concern: Food Insecurity - Medium Risk (11/26/2023)   Received from [facility]  Housing: Low Risk  (01/13/2024)  Recent Concern: Housing - Medium Risk (11/26/2023)   Received from [facility]  Transportation Needs: No Transportation Needs (01/13/2024)  Recent Concern: Transportation Needs - Unmet Transportation Needs (11/26/2023)   Received from [facility]  Utilities: Not At Risk (01/13/2024)  Depression (PHQ2-9): High Risk (07/22/2022)  Tobacco Use: Medium Risk (01/13/2024)     Readmission Risk Interventions     No data to display

## 2024-01-15 NOTE — Plan of Care (Signed)

## 2024-01-18 ENCOUNTER — Telehealth: Payer: Self-pay | Admitting: *Deleted

## 2024-01-18 DIAGNOSIS — Z5941 Food insecurity: Secondary | ICD-10-CM

## 2024-01-18 LAB — CULTURE, BLOOD (ROUTINE X 2)
Culture: NO GROWTH
Culture: NO GROWTH
Special Requests: ADEQUATE
Special Requests: ADEQUATE

## 2024-01-18 NOTE — Transitions of Care (Post Inpatient/ED Visit) (Signed)
 01/18/2024  Name: Jeanne Harrison MRN: [id number] DOB: 2000-09-17  Today's TOC FU Call Status: Today's TOC FU Call Status:: Successful TOC FU Call Completed TOC FU Call Complete Date: 01/18/24 Patient's Name and Date of Birth confirmed.  Transition Care Management Follow-up Telephone Call Date of Discharge: 01/15/24 Discharge Facility: [facility]) Type of Discharge: Inpatient Admission Primary Inpatient Discharge Diagnosis:: SIRS (systemic inflammatory response syndrome) How have you been since you were released from the hospital?: Same Any questions or concerns?: Yes Patient Questions/Concerns:: Patient concerned that she is unable to have a normal BM Patient Questions/Concerns Addressed: Provided Patient Educational Materials  Items Reviewed: Did you receive and understand the discharge instructions provided?: Yes Medications obtained,verified, and reconciled?: Yes (Medications Reviewed) Any new allergies since your discharge?: No Dietary orders reviewed?: NA Do you have support at home?: Yes People in Home [RPT]: parent(s) Name of Support/Comfort Primary Source: [name]  Medications Reviewed Today: Medications Reviewed Today     Reviewed by Lucky Andrea LABOR, RN (Registered Nurse) on 01/18/24 at 1100  Med List Status: <None>   Medication Order Taking? Sig Documenting Provider Last Dose Status Informant  oxyCODONE  (ROXICODONE ) 5 MG/5ML solution [id number]  Take 5 mLs (5 mg total) by mouth every 6 (six) hours as needed for up to 5 days for severe pain (pain score 7-10) or breakthrough pain.  Patient not taking: Reported on 01/18/2024   Gherghe, Costin M, MD  Active   valACYclovir  (VALTREX ) 1000 MG tablet [id number]  At the start of an outbreak, take 1 tablet daily for 5 days.  Patient not taking: Reported on 01/18/2024   Christopher Savannah, PA-C  Active Self, Pharmacy Records, Multiple Informants            Home Care and Equipment/Supplies: Were Home Health Services  Ordered?: No Any new equipment or medical supplies ordered?: No  Functional Questionnaire: Do you need assistance with bathing/showering or dressing?: No Do you need assistance with meal preparation?: No Do you need assistance with eating?: No Do you have difficulty maintaining continence: No Do you need assistance with getting out of bed/getting out of a chair/moving?: No Do you have difficulty managing or taking your medications?: No  Follow up appointments reviewed: PCP Follow-up appointment confirmed?: No (patient prefers to establish care with new PCP) MD Provider Line Number:[phone] Given: Yes Specialist Hospital Follow-up appointment confirmed?: NA Do you need transportation to your follow-up appointment?: No Do you understand care options if your condition(s) worsen?: Yes-patient verbalized understanding  SDOH Interventions Today    Flowsheet Row Most Recent Value  SDOH Interventions   Food Insecurity Interventions Other (Comment)  [referral to [facility] BSW]  Housing Interventions Intervention Not Indicated  Transportation Interventions Intervention Not Indicated  Utilities Interventions Intervention Not Indicated    Goals Addressed             This Visit's Progress    [facility] Transitions of Care (TOC) Care Plan       Problems:  Recent Hospitalization for treatment of SIRS Knowledge Deficit Related to Health Management and No Hospital Follow Up Provider appointment patient wishes to establish care with new PCP  Goal:  Over the next 30 days, the patient will not experience hospital readmission  Interventions:  Transitions of Care: Doctor Visits  - discussed the importance of doctor visits Post discharge activity limitations prescribed by provider reviewed Reviewed Signs and symptoms of infection Provided patient with MD referral line [phone] to call and schedule with new PCP to establish care Provided  education regarding constipation SDOH  assessment Medication review-patient completed augmentin  and metronidazole   Patient Self Care Activities:  Participate in Transition of Care Program/Attend TOC scheduled calls Take medications as prescribed   Work with the social worker to address care coordination needs and will continue to work with the clinical team to address health care and disease management related needs Establish care with PCP Drink more water  (6-8 glasses a day) Eat more fruits and vegetables Discuss any concerns or questions with PCP  Plan:  Telephone follow up appointment with care management team member scheduled for:  01/26/24 at 1pm        Andrea Dimes RN, BSN [place]  [facility] RN Care Manager [phone]

## 2024-01-21 ENCOUNTER — Ambulatory Visit: Payer: Self-pay

## 2024-01-21 ENCOUNTER — Telehealth: Payer: Self-pay | Admitting: Family Medicine

## 2024-01-21 NOTE — Telephone Encounter (Signed)
 Copied from CRM [id number]. Topic: Appointments - Transfer of Care >> Jan 21, 2024 10:27 AM Adelita E wrote:  Pt is requesting to transfer FROM: Enobong Newlin, MD Pt is requesting to transfer TO: Corean Ku, FNP Reason for requested transfer: No reason given at this time, was transferred this call. It is the responsibility of the team the patient would like to transfer to [place] Ku, FNP) to reach out to the patient if for any reason this transfer is not acceptable.

## 2024-01-21 NOTE — Telephone Encounter (Signed)
  FYI Only or Action Required?: FYI only for provider.  Patient was last seen in primary care on 07/22/2022 by Newlin, Enobong, MD.  Called Nurse Triage reporting Abdominal Pain.  Symptoms began a week ago.  Interventions attempted: Nothing.  Symptoms are: gradually improving.  Triage Disposition: See Physician Within 24 Hours Has new patient apt with 04/18/24 per request Patient/caregiver understands and will follow disposition?: No, refuses disposition   Copied from CRM [id number]. Topic: Clinical - Red Word Triage >> Jan 21, 2024 10:08 AM Tobias L wrote: Red Word that prompted transfer to Nurse Triage: abdominal and lower back pain, patient rates pain at 7   Looking for new PCP Reason for Disposition  [1] MODERATE pain (e.g., interferes with normal activities) AND [2] pain comes and goes (cramps) AND [3] present > 24 hours  (Exception: Pain with Vomiting or Diarrhea - see that Guideline.)  Answer Assessment - Initial Assessment Questions 1. LOCATION: Where does it hurt?      Lower part of abd; states was in hospital on the first of July; dx with food poisoning  2. RADIATION: Does the pain shoot anywhere else? (e.g., chest, back)     back 3. ONSET: When did the pain begin? (e.g., minutes, hours or days ago)      July 1 4. SUDDEN: Gradual or sudden onset?     gradually 5. PATTERN Does the pain come and go, or is it constant?     Comes and goes 6. SEVERITY: How bad is the pain?  (e.g., Scale 1-10; mild, moderate, or severe)     7/10 7. RECURRENT SYMPTOM: Have you ever had this type of stomach pain before? If Yes, ask: When was the last time? and What happened that time?      Yes, food poisoning 8. CAUSE: What do you think is causing the stomach pain? (e.g., gallstones, recent abdominal surgery)     unknown 9. RELIEVING/AGGRAVATING FACTORS: What makes it better or worse? (e.g., antacids, bending or twisting motion, bowel movement)     Laying on stomache 10.  OTHER SYMPTOMS: Do you have any other symptoms? (e.g., back pain, diarrhea, fever, urination pain, vomiting)       Back pain 11. PREGNANCY: Is there any chance you are pregnant? When was your last menstrual period?       no  Protocols used: Abdominal Pain - [facility]

## 2024-01-21 NOTE — Telephone Encounter (Signed)
 fyi

## 2024-01-23 DIAGNOSIS — Z419 Encounter for procedure for purposes other than remedying health state, unspecified: Secondary | ICD-10-CM | POA: Diagnosis not present

## 2024-01-26 ENCOUNTER — Other Ambulatory Visit: Payer: Self-pay | Admitting: *Deleted

## 2024-01-26 NOTE — Patient Instructions (Signed)
 Visit Information  Thank you for taking time to visit with me today. Please don't hesitate to contact me if I can be of assistance to you before our next scheduled telephone appointment.   Following is a copy of your care plan:   Goals Addressed             This Visit's Progress    VBCI Transitions of Care (TOC) Care Plan       Problems:  Recent Hospitalization for treatment of SIRS Knowledge Deficit Related to Health Management and No Hospital Follow Up Provider appointment patient wishes to establish care with new PCP  Goal:  Over the next 30 days, the patient will not experience hospital readmission  Interventions:  Transitions of Care: Doctor Visits  - discussed the importance of doctor visits Reviewed member benefits provided by [facility] [phone], advised patient to call an inquire about [employer], OTC benefits and Member rewards Discussed the benefits of exercise to improve physical and mental health Advised patient to record headaches and share with provider Reviewed upcoming appointment-patient scheduled with new PCP on 04/18/24(earliest appointment) Congratulated patient on increasing fruits, vegetables and water  intake   Patient Self Care Activities:  Participate in Transition of Care Program/Attend TOC scheduled calls Take medications as prescribed   Work with the social worker to address care coordination needs and will continue to work with the clinical team to address health care and disease management related needs Establish care with PCP Drink more water  (6-8 glasses a day) Eat more fruits and vegetables Discuss any concerns or questions with PCP  Plan:  Telephone follow up appointment with care management team member scheduled for:  02/02/24 at 1:30pm        Patient verbalizes understanding of instructions and care plan provided today and agrees to view in MyChart. Active MyChart status and patient understanding of how to access instructions  and care plan via MyChart confirmed with patient.     Telephone follow up appointment with care management team member scheduled for:02/02/24 at 1:30pm  Please call the care guide team at [phone] if you need to cancel or reschedule your appointment.   Please call 1-800-273-TALK (toll free, 24 hour hotline) go to [facility] Urgent [facility] [address] [place] [phone]) call 911 if you are experiencing a Mental Health or Behavioral Health Crisis or need someone to talk to.  Andrea Dimes RN, BSN [place]  Value-Based Care Institute [facility] Health RN Care Manager [phone]

## 2024-01-26 NOTE — Transitions of Care (Post Inpatient/ED Visit) (Signed)
  Transition of Care week 2  Visit Note  01/26/2024  Name: Jeanne Harrison MRN: [id number]          DOB: 2001-03-29  Situation: Patient enrolled in [facility] 30-day program. Visit completed with [name] by telephone.   Background:   Initial Transition Care Management Follow-up Telephone Call    No past medical history on file.  Assessment: Patient Reported Symptoms: Cognitive Cognitive Status: Able to follow simple commands, Alert and oriented to person, place, and time, Normal speech and language skills, Insightful and able to interpret abstract concepts   Health Facilitated by: Rest  Neurological Neurological Review of Symptoms: Headaches Neurological Self-Management Outcome: 3 (uncertain) Neurological Comment: No longer having dizziness. Continues to have headaches when at work. Relieved with rest and a dark room.RNCM advised patient to record headaches and share with provider.  HEENT HEENT Symptoms Reported: Not assessed      Cardiovascular Cardiovascular Symptoms Reported: Not assessed    Respiratory Respiratory Symptoms Reported: No symptoms reported    Endocrine Endocrine Symptoms Reported: No symptoms reported Is patient diabetic?: No    Gastrointestinal Gastrointestinal Symptoms Reported: No symptoms reported Additional Gastrointestinal Details: LBM 01/26/24 Gastrointestinal Self-Management Outcome: 4 (good)    Genitourinary Genitourinary Symptoms Reported: No symptoms reported    Integumentary Integumentary Symptoms Reported: No symptoms reported    Musculoskeletal Musculoskelatal Symptoms Reviewed: No symptoms reported   Falls in the past year?: No    Psychosocial Psychosocial Symptoms Reported: No symptoms reported         There were no vitals filed for this visit.  Medications Reviewed Today     Reviewed by Lucky Andrea LABOR, RN (Registered Nurse) on 01/26/24 at 1307  Med List Status: <None>   Medication Order Taking? Sig Documenting Provider Last  Dose Status Informant  valACYclovir  (VALTREX ) 1000 MG tablet [id number]  At the start of an outbreak, take 1 tablet daily for 5 days.  Patient not taking: Reported on 01/26/2024   Christopher Savannah, PA-C  Active Self, Pharmacy Records, Multiple Informants            Recommendation:   Continue Current Plan of Care  Follow Up Plan:   Telephone follow-up in 1 week  Andrea Lucky RN, BSN [place]  Value-Based Care Institute [facility] Health RN Care Manager [phone]

## 2024-02-02 ENCOUNTER — Other Ambulatory Visit: Payer: Self-pay | Admitting: *Deleted

## 2024-02-02 NOTE — Patient Instructions (Signed)
 Visit Information  Thank you for taking time to visit with me today. Please don't hesitate to contact me if I can be of assistance to you before our next scheduled telephone appointment.   Following is a copy of your care plan:   Goals Addressed             This Visit's Progress    VBCI Transitions of Care (TOC) Care Plan       Problems:  Recent Hospitalization for treatment of SIRS Knowledge Deficit Related to Health Management and No Hospital Follow Up Provider appointment patient wishes to establish care with new PCP  Goal:  Over the next 30 days, the patient will not experience hospital readmission  Interventions:  Transitions of Care: Doctor Visits  - discussed the importance of doctor visits Reviewed member benefits provided by [facility] [phone], advised patient to call an inquire about [employer], OTC benefits and Member rewards-revisited Discussed the benefits of exercise to improve physical and mental health Advised patient to record headaches and share with provider Reviewed upcoming appointment-patient scheduled with new PCP on 04/18/24(earliest appointment)   Patient Self Care Activities:  Participate in Transition of Care Program/Attend TOC scheduled calls Take medications as prescribed   Work with the social worker to address care coordination needs and will continue to work with the clinical team to address health care and disease management related needs Establish care with PCP Drink more water  (6-8 glasses a day) Eat more fruits and vegetables Discuss any concerns or questions with PCP  Plan:  Telephone follow up appointment with care management team member scheduled for:  02/09/24 at 1:00pm        Patient verbalizes understanding of instructions and care plan provided today and agrees to view in MyChart. Active MyChart status and patient understanding of how to access instructions and care plan via MyChart confirmed with patient.     Telephone  follow up appointment with care management team member scheduled for:02/09/24 at 1pm  Please call the care guide team at [phone] if you need to cancel or reschedule your appointment.   Please call 1-800-273-TALK (toll free, 24 hour hotline) go to [facility] Urgent [facility] [address] [place] [phone]) call 911 if you are experiencing a Mental Health or Behavioral Health Crisis or need someone to talk to.  Andrea Dimes RN, BSN [place]  Value-Based Care Institute [facility] Health RN Care Manager [phone]

## 2024-02-02 NOTE — Transitions of Care (Post Inpatient/ED Visit) (Signed)
  Transition of Care week 3  Visit Note  02/02/2024  Name: Jeanne Harrison MRN: [id number]          DOB: 01/29/2001  Situation: Patient enrolled in [facility] 30-day program. Visit completed with Ms. Mcdonnell by telephone.   Background:   Initial Transition Care Management Follow-up Telephone Call    No past medical history on file.  Assessment: Patient Reported Symptoms: Cognitive Cognitive Status: No symptoms reported      Neurological Neurological Review of Symptoms: Headaches Neurological Management Strategies: Routine screening Neurological Self-Management Outcome: 3 (uncertain) Neurological Comment: Continues to have headaches. Referred to PT  HEENT HEENT Symptoms Reported: No symptoms reported      Cardiovascular Cardiovascular Symptoms Reported: No symptoms reported    Respiratory Respiratory Symptoms Reported: No symptoms reported    Endocrine Endocrine Symptoms Reported: No symptoms reported    Gastrointestinal Gastrointestinal Symptoms Reported: No symptoms reported      Genitourinary Genitourinary Symptoms Reported: No symptoms reported    Integumentary Integumentary Symptoms Reported: No symptoms reported    Musculoskeletal Musculoskelatal Symptoms Reviewed: No symptoms reported        Psychosocial Psychosocial Symptoms Reported: No symptoms reported         There were no vitals filed for this visit.  Medications Reviewed Today     Reviewed by Lucky Andrea LABOR, RN (Registered Nurse) on 02/02/24 at 1335  Med List Status: <None>   Medication Order Taking? Sig Documenting Provider Last Dose Status Informant  valACYclovir  (VALTREX ) 1000 MG tablet [id number]  At the start of an outbreak, take 1 tablet daily for 5 days.  Patient not taking: Reported on 02/02/2024   Christopher Savannah, PA-C  Active Self, Pharmacy Records, Multiple Informants            Recommendation:   Continue Current Plan of Care  Follow Up Plan:   Telephone follow-up in 1  week  Andrea Lucky RN, BSN [place]  Value-Based Care Institute [facility] Health RN Care Manager [phone]

## 2024-02-09 ENCOUNTER — Other Ambulatory Visit: Payer: Self-pay | Admitting: *Deleted

## 2024-02-09 NOTE — Transitions of Care (Post Inpatient/ED Visit) (Signed)
   02/09/2024  Name: Jeanne Harrison MRN: [id number] DOB: 20-Jan-2001  RNCM spoke with [name]. She is unable to keep this telephone call today and request a telephone call tomorrow. A new telephone appointment was made for 02/10/24 at 10:30am. [name] agreed to new date and time.  Andrea Dimes RN, BSN [place]  Value-Based Care Institute [facility] Health RN Care Manager [phone]

## 2024-02-10 ENCOUNTER — Telehealth: Payer: Self-pay | Admitting: *Deleted

## 2024-02-10 ENCOUNTER — Encounter: Payer: Self-pay | Admitting: *Deleted

## 2024-02-10 NOTE — Progress Notes (Signed)
 Complex Care Management Note Care Guide Note  02/10/2024 Name: Jeanne Harrison MRN: [id number] DOB: Sep 14, 2000   Complex Care Management Outreach Attempts: An unsuccessful telephone outreach was attempted today to offer the patient information about available complex care management services.  Follow Up Plan:  Additional outreach attempts will be made to offer the patient complex care management information and services.   Encounter Outcome:  No Answer  [facility] HealthPopulation Health Care Guide  Direct Dial:[phone] [phone] Website: [email or web address]

## 2024-02-11 ENCOUNTER — Telehealth: Payer: Self-pay | Admitting: *Deleted

## 2024-02-11 ENCOUNTER — Encounter: Payer: Self-pay | Admitting: *Deleted

## 2024-02-11 NOTE — Progress Notes (Signed)
 Complex Care Management Note Care Guide Note  02/11/2024 Name: Jeanne Harrison MRN: [id number] DOB: [birth year]   Complex Care Management Outreach Attempts: An unsuccessful telephone outreach was attempted today to offer the patient information about available complex care management services.  Follow Up Plan:  Additional outreach attempts will be made to offer the patient complex care management information and services.   Encounter Outcome:  No Answer  [facility] HealthPopulation Health Care Guide  Direct [phone] Fax:[phone] Website: [email or web address]

## 2024-02-12 ENCOUNTER — Telehealth: Payer: Self-pay | Admitting: *Deleted

## 2024-02-12 NOTE — Progress Notes (Signed)
 Complex Care Management Note Care Guide Note  02/12/2024 Name: Jeanne Harrison MRN: [id number] DOB: June 12, 2001  [name] is a 23 y.o. year old female who is a primary care patient of Newlin, Enobong, MD . The community resource team was consulted for assistance with Food Insecurity  SDOH screenings and interventions completed:  Yes     SDOH Interventions Today    Flowsheet Row Most Recent Value  SDOH Interventions   Food Insecurity Interventions AMB Referral, Community Resources Provided  [provided food banks and also [facility] hospital benefit]  Housing Interventions Community Resources Provided  [Provided information housing aookications at this time]     Care guide performed the following interventions: Patient provided with information about care guide support team and interviewed to confirm resource needs.  Follow Up Plan:  No further follow up planned at this time. The patient has been provided with needed resources.  Encounter Outcome:  Patient Visit Completed  [name]  [facility] HealthPopulation Health Care Guide  Direct Dial:[phone] [phone] Website: [email or web address]

## 2024-02-12 NOTE — Patient Instructions (Signed)
 Visit Information  Thank you for taking time to visit with me today. Please don't hesitate to contact me if I can be of assistance to you before our next scheduled telephone appointment.   Following is a copy of your care plan:   Goals Addressed             This Visit's Progress    VBCI Transitions of Care (TOC) Care Plan       Problems:  Recent Hospitalization for treatment of SIRS Knowledge Deficit Related to Health Management and No Hospital Follow Up Provider appointment patient wishes to establish care with new PCP  Goal:  Over the next 30 days, the patient will not experience hospital readmission  Interventions:  Transitions of Care: Doctor Visits  - discussed the importance of doctor visits Reviewed member benefits provided by [facility] [phone], advised patient to call an inquire about [employer], OTC benefits and Member rewards-revisited discussed hours M-S, 7am-6pm Discussed the benefits of exercise to improve physical and mental health-patient took a walk this morning and reports feeling great Reviewed upcoming appointment-patient scheduled with new PCP on 04/18/24(earliest appointment) Collaborated with Care Guide for scheduling with BSW   Patient Self Care Activities:  Participate in Transition of Care Program/Attend TOC scheduled calls Take medications as prescribed   Work with the social worker to address care coordination needs and will continue to work with the clinical team to address health care and disease management related needs Establish care with PCP Drink more water  (6-8 glasses a day) Eat more fruits and vegetables Discuss any concerns or questions with PCP  Plan:  Telephone follow up appointment with care management team member scheduled for:  02/17/24 at 9:30am        Patient verbalizes understanding of instructions and care plan provided today and agrees to view in MyChart. Active MyChart status and patient understanding of how to  access instructions and care plan via MyChart confirmed with patient.     Telephone follow up appointment with care management team member scheduled for:02/17/24 at 9:30am  Please call the care guide team at [phone] if you need to cancel or reschedule your appointment.   Please call 1-800-273-TALK (toll free, 24 hour hotline) go to [facility] Urgent [facility] [address] [place] [phone]) call 911 if you are experiencing a Mental Health or Behavioral Health Crisis or need someone to talk to.  Andrea Dimes RN, BSN [place]  Value-Based Care Institute [facility] Health RN Care Manager [phone]

## 2024-02-12 NOTE — Transitions of Care (Post Inpatient/ED Visit) (Signed)
  Transition of Care week 4  Visit Note  02/12/2024  Name: GARNETTA FEDRICK MRN: [id number]          DOB: [birth year]  Situation: Patient enrolled in [facility] 30-day program. Visit completed with Ms. Rochette by telephone.   Background:   Initial Transition Care Management Follow-up Telephone Call    No past medical history on file.  Assessment: Patient Reported Symptoms: Cognitive Cognitive Status: No symptoms reported      Neurological Neurological Review of Symptoms: No symptoms reported Neurological Self-Management Outcome: 4 (good) Neurological Comment: Patient reports feeling great.  HEENT HEENT Symptoms Reported: Not assessed      Cardiovascular Cardiovascular Symptoms Reported: Not assessed    Respiratory Respiratory Symptoms Reported: Not assesed    Endocrine Endocrine Symptoms Reported: Not assessed    Gastrointestinal Gastrointestinal Symptoms Reported: No symptoms reported Gastrointestinal Self-Management Outcome: 4 (good)    Genitourinary Genitourinary Symptoms Reported: Not assessed    Integumentary Integumentary Symptoms Reported: Not assessed    Musculoskeletal Musculoskelatal Symptoms Reviewed: Not assessed        Psychosocial Psychosocial Symptoms Reported: Not assessed         There were no vitals filed for this visit.  Medications Reviewed Today     Reviewed by Lucky Andrea LABOR, RN (Registered Nurse) on 02/12/24 at 0957  Med List Status: <None>   Medication Order Taking? Sig Documenting Provider Last Dose Status Informant  valACYclovir  (VALTREX ) 1000 MG tablet [id number]  At the start of an outbreak, take 1 tablet daily for 5 days.  Patient not taking: Reported on 02/12/2024   Christopher Savannah, PA-C  Active Self, Pharmacy Records, Multiple Informants            Recommendation:   Continue Current Plan of Care  Follow Up Plan:   Telephone follow-up in 1 week  Andrea Lucky RN, BSN [place]  Value-Based Care Institute [facility] Health RN  Care Manager [phone]

## 2024-02-16 ENCOUNTER — Encounter: Payer: Self-pay | Admitting: *Deleted

## 2024-02-16 ENCOUNTER — Other Ambulatory Visit: Payer: Self-pay | Admitting: *Deleted

## 2024-02-16 NOTE — Patient Instructions (Signed)
 Visit Information  Thank you for taking time to visit with me today. Please don't hesitate to contact me if I can be of assistance to you before our next scheduled telephone appointment.   Following is a copy of your care plan:   Goals Addressed             This Visit's Progress    COMPLETED: VBCI Transitions of Care (TOC) Care Plan       Problems:  Recent Hospitalization for treatment of SIRS Knowledge Deficit Related to Health Management and No Hospital Follow Up Provider appointment patient wishes to establish care with new PCP  Goal:  Over the next 30 days, the patient will not experience hospital readmission  Interventions:  Transitions of Care: Doctor Visits  - discussed the importance of doctor visits Reviewed upcoming appointment-patient scheduled with new PCP on 04/18/24(earliest appointment), discussed the importance of attending  Ensured patient received resources from Care Guide Discussed completion of TOC 30-day Program   Patient Self Care Activities:  Participate in Transition of Care Program/Attend TOC scheduled calls Take medications as prescribed   Work with the social worker to address care coordination needs and will continue to work with the clinical team to address health care and disease management related needs Establish care with PCP Drink more water  (6-8 glasses a day) Eat more fruits and vegetables Discuss any concerns or questions with PCP  Plan:  No further follow up required:          Patient verbalizes understanding of instructions and care plan provided today and agrees to view in MyChart. Active MyChart status and patient understanding of how to access instructions and care plan via MyChart confirmed with patient.     No further follow up required:    Please call the care guide team at [phone] if you need to cancel or reschedule your appointment.   Please call 1-800-273-TALK (toll free, 24 hour hotline) go to [facility] Urgent [facility] [address], [place] [phone]) call 911 if you are experiencing a Mental Health or Behavioral Health Crisis or need someone to talk to.  Andrea Dimes RN, BSN [place]  Value-Based Care Institute [facility] Health RN Care Manager [phone]

## 2024-02-16 NOTE — Transitions of Care (Post Inpatient/ED Visit) (Signed)
  Transition of Care week 5  Visit Note  02/16/2024  Name: Jeanne Harrison MRN: [id number]          DOB: 12/01/2000  Situation: Patient enrolled in [facility] 30-day program. Visit completed with Ms. Venuti by telephone.   Background:   Initial Transition Care Management Follow-up Telephone Call    No past medical history on file.  Assessment: Patient Reported Symptoms: Cognitive Cognitive Status: No symptoms reported      Neurological Neurological Review of Symptoms: No symptoms reported    HEENT HEENT Symptoms Reported: Not assessed      Cardiovascular Cardiovascular Symptoms Reported: No symptoms reported    Respiratory Respiratory Symptoms Reported: No symptoms reported    Endocrine Endocrine Symptoms Reported: Not assessed    Gastrointestinal Gastrointestinal Symptoms Reported: No symptoms reported      Genitourinary Genitourinary Symptoms Reported: No symptoms reported    Integumentary Integumentary Symptoms Reported: Not assessed    Musculoskeletal Musculoskelatal Symptoms Reviewed: No symptoms reported        Psychosocial Psychosocial Symptoms Reported: Not assessed         There were no vitals filed for this visit.  Medications Reviewed Today     Reviewed by Lucky Andrea LABOR, RN (Registered Nurse) on 02/16/24 at [phone]  Med List Status: <None>   Medication Order Taking? Sig Documenting Provider Last Dose Status Informant  valACYclovir  (VALTREX ) 1000 MG tablet [id number]  At the start of an outbreak, take 1 tablet daily for 5 days.  Patient not taking: Reported on 02/16/2024   Christopher Savannah, PA-C  Active Self, Pharmacy Records, Multiple Informants            Recommendation:   Continue Current Plan of Care  Follow Up Plan:   Closing From:  Transitions of Care Program  Andrea Lucky RN, BSN [place]  Value-Based Care Institute [facility] Health RN Care Manager [phone]

## 2024-02-23 DIAGNOSIS — Z419 Encounter for procedure for purposes other than remedying health state, unspecified: Secondary | ICD-10-CM | POA: Diagnosis not present

## 2024-03-25 DIAGNOSIS — Z419 Encounter for procedure for purposes other than remedying health state, unspecified: Secondary | ICD-10-CM | POA: Diagnosis not present

## 2024-04-18 ENCOUNTER — Other Ambulatory Visit (HOSPITAL_COMMUNITY)
Admission: RE | Admit: 2024-04-18 | Discharge: 2024-04-18 | Disposition: A | Discharge status: Home / Self Care | Source: Ambulatory Visit | Attending: Family Medicine | Admitting: Family Medicine

## 2024-04-18 ENCOUNTER — Encounter: Payer: Self-pay | Admitting: Family Medicine

## 2024-04-18 ENCOUNTER — Ambulatory Visit: Admitting: Family Medicine

## 2024-04-18 ENCOUNTER — Other Ambulatory Visit: Payer: Self-pay

## 2024-04-18 VITALS — BP 128/76 | HR 96 | Temp 98.8°F | Ht 63.0 in | Wt 263.8 lb

## 2024-04-18 DIAGNOSIS — R6510 Systemic inflammatory response syndrome (SIRS) of non-infectious origin without acute organ dysfunction: Secondary | ICD-10-CM

## 2024-04-18 DIAGNOSIS — N898 Other specified noninflammatory disorders of vagina: Secondary | ICD-10-CM

## 2024-04-18 DIAGNOSIS — R739 Hyperglycemia, unspecified: Secondary | ICD-10-CM

## 2024-04-18 DIAGNOSIS — R195 Other fecal abnormalities: Secondary | ICD-10-CM | POA: Diagnosis not present

## 2024-04-18 DIAGNOSIS — M7989 Other specified soft tissue disorders: Secondary | ICD-10-CM

## 2024-04-18 DIAGNOSIS — N912 Amenorrhea, unspecified: Secondary | ICD-10-CM | POA: Diagnosis not present

## 2024-04-18 DIAGNOSIS — G43809 Other migraine, not intractable, without status migrainosus: Secondary | ICD-10-CM | POA: Diagnosis not present

## 2024-04-18 DIAGNOSIS — L700 Acne vulgaris: Secondary | ICD-10-CM

## 2024-04-18 DIAGNOSIS — G43909 Migraine, unspecified, not intractable, without status migrainosus: Secondary | ICD-10-CM | POA: Insufficient documentation

## 2024-04-18 DIAGNOSIS — Z23 Encounter for immunization: Secondary | ICD-10-CM | POA: Diagnosis not present

## 2024-04-18 DIAGNOSIS — M722 Plantar fascial fibromatosis: Secondary | ICD-10-CM

## 2024-04-18 DIAGNOSIS — N910 Primary amenorrhea: Secondary | ICD-10-CM

## 2024-04-18 DIAGNOSIS — E049 Nontoxic goiter, unspecified: Secondary | ICD-10-CM

## 2024-04-18 DIAGNOSIS — Z6841 Body Mass Index (BMI) 40.0 and over, adult: Secondary | ICD-10-CM | POA: Insufficient documentation

## 2024-04-18 DIAGNOSIS — F411 Generalized anxiety disorder: Secondary | ICD-10-CM | POA: Diagnosis not present

## 2024-04-18 DIAGNOSIS — L680 Hirsutism: Secondary | ICD-10-CM

## 2024-04-18 LAB — CBC WITH DIFFERENTIAL/PLATELET
Basophils Absolute: 0 K/uL (ref 0.0–0.1)
Basophils Relative: 0.7 % (ref 0.0–3.0)
Eosinophils Absolute: 0.1 K/uL (ref 0.0–0.7)
Eosinophils Relative: 1.4 % (ref 0.0–5.0)
HCT: 42.3 % (ref 36.0–46.0)
Hemoglobin: 13.8 g/dL (ref 12.0–15.0)
Lymphocytes Relative: 47.8 % — ABNORMAL HIGH (ref 12.0–46.0)
Lymphs Abs: 2.6 K/uL (ref 0.7–4.0)
MCHC: 32.6 g/dL (ref 30.0–36.0)
MCV: 82.2 fl (ref 78.0–100.0)
Monocytes Absolute: 0.3 K/uL (ref 0.1–1.0)
Monocytes Relative: 6.2 % (ref 3.0–12.0)
Neutro Abs: 2.4 K/uL (ref 1.4–7.7)
Neutrophils Relative %: 43.9 % (ref 43.0–77.0)
Platelets: 262 K/uL (ref 150.0–400.0)
RBC: 5.14 Mil/uL — ABNORMAL HIGH (ref 3.87–5.11)
RDW: 14.9 % (ref 11.5–15.5)
WBC: 5.4 K/uL (ref 4.0–10.5)

## 2024-04-18 LAB — VITAMIN B12: Vitamin B-12: 303 pg/mL (ref 211–911)

## 2024-04-18 LAB — HEMOGLOBIN A1C: Hgb A1c MFr Bld: 5.7 % (ref 4.6–6.5)

## 2024-04-18 LAB — SEDIMENTATION RATE: Sed Rate: 43 mm/h — ABNORMAL HIGH (ref 0–20)

## 2024-04-18 LAB — TSH: TSH: 1.21 u[IU]/mL (ref 0.35–5.50)

## 2024-04-18 LAB — TESTOSTERONE: Testosterone: 120.86 ng/dL — ABNORMAL HIGH (ref 15.00–40.00)

## 2024-04-18 LAB — VITAMIN D 25 HYDROXY (VIT D DEFICIENCY, FRACTURES): VITD: 7.21 ng/mL — ABNORMAL LOW (ref 30.00–100.00)

## 2024-04-18 MED ORDER — BUSPIRONE HCL 10 MG PO TABS
10.0000 mg | ORAL_TABLET | Freq: Two times a day (BID) | ORAL | 1 refills | Status: AC
  Filled 2024-04-18: qty 60, 30d supply, fill #0
  Filled 2024-05-16: qty 60, 30d supply, fill #1

## 2024-04-18 MED ORDER — CLINDAMYCIN PHOS-BENZOYL PEROX 1-5 % EX GEL
Freq: Two times a day (BID) | CUTANEOUS | 0 refills | Status: DC
  Filled 2024-04-18: qty 25, 30d supply, fill #0

## 2024-04-18 NOTE — Progress Notes (Unsigned)
 New Patient Visit  Subjective:     Patient ID: Jeanne Harrison, female    DOB: March 31, 2001, 23 y.o.   MRN: [id number]  No chief complaint on file.   HPI  Discussed the use of AI scribe software for clinical note transcription with the patient, who gave verbal consent to proceed.  History of Present Illness Jeanne Harrison is a 23 year old female who presents with concerns about potential herpes diagnosis and absence of menstruation.  Genital herpes concerns - Previous diagnosis of genital herpes - Prescribed Valtrex  but has never experienced an outbreak - Has never taken Valtrex  - Requests retesting due to concerns about accuracy of initial diagnosis  Primary amenorrhea - Menstruation has never occurred at age 44  Hirsutism - Increased hair growth  Vaginal discharge - Clear vaginal discharge with a musky, fishy odor - Previous testing for vaginal discharge was normal  Sexual activity - Sexually active and in a relationship     ROS Per HPI  Outpatient Encounter Medications as of 04/18/2024  Medication Sig   valACYclovir  (VALTREX ) 1000 MG tablet At the start of an outbreak, take 1 tablet daily for 5 days. (Patient not taking: Reported on 02/16/2024)   No facility-administered encounter medications on file as of 04/18/2024.    No past medical history on file.  Past Surgical History:  Procedure Laterality Date   FOOT SURGERY      Family History  Problem Relation Age of Onset   Hypertension Mother    Hypertension Father    Diabetes Father    Kidney failure Father     Social History   Socioeconomic History   Marital status: Single    Spouse name: Not on file   Number of children: 0   Years of education: Not on file   Highest education level: 12th grade  Occupational History   Not on file  Tobacco Use   Smoking status: Passive Smoke Exposure - Never Smoker   Smokeless tobacco: Never  Vaping Use   Vaping status: Never Used  Substance and Sexual  Activity   Alcohol use: Never   Drug use: Yes    Frequency: 2.0 times per week    Types: Marijuana   Sexual activity: Yes  Other Topics Concern   Not on file  Social History Narrative   Not on file   Social Drivers of Health   Financial Resource Strain: High Risk (04/14/2024)   Overall Financial Resource Strain (CARDIA)    Difficulty of Paying Living Expenses: Hard  Food Insecurity: Food Insecurity Present (04/14/2024)   Hunger Vital Sign    Worried About Running Out of Food in the Last Year: Often true    Ran Out of Food in the Last Year: Often true  Transportation Needs: Unmet Transportation Needs (04/14/2024)   PRAPARE - Administrator, Civil Service (Medical): Yes    Lack of Transportation (Non-Medical): Yes  Physical Activity: Insufficiently Active (04/14/2024)   Exercise Vital Sign    Days of Exercise per Week: 2 days    Minutes of Exercise per Session: 10 min  Stress: Stress Concern Present (04/14/2024)   [employer] of Occupational Health - Occupational Stress Questionnaire    Feeling of Stress: Very much  Social Connections: Socially Isolated (04/14/2024)   Social Connection and Isolation Panel    Frequency of Communication with Friends and Family: More than three times a week    Frequency of Social Gatherings with Friends and Family: More than  three times a week    Attends Religious Services: Never    Active Member of Clubs or Organizations: No    Attends Banker Meetings: Not on file    Marital Status: Never married  Intimate Partner Violence: Not At Risk (01/18/2024)   Humiliation, Afraid, Rape, and Kick questionnaire    Fear of Current or Ex-Partner: No    Emotionally Abused: No    Physically Abused: No    Sexually Abused: No       Objective:    There were no vitals taken for this visit.   Physical Exam Vitals and nursing note reviewed.  Constitutional:      General: She is not in acute distress.    Appearance: Normal  appearance. She is normal weight.  HENT:     Head: Normocephalic and atraumatic.     Right Ear: External ear normal.     Left Ear: External ear normal.     Nose: Nose normal.     Mouth/Throat:     Mouth: Mucous membranes are moist.     Pharynx: Oropharynx is clear.  Eyes:     Extraocular Movements: Extraocular movements intact.     Pupils: Pupils are equal, round, and reactive to light.  Cardiovascular:     Rate and Rhythm: Normal rate and regular rhythm.     Pulses: Normal pulses.     Heart sounds: Normal heart sounds.  Pulmonary:     Effort: Pulmonary effort is normal. No respiratory distress.     Breath sounds: Normal breath sounds. No wheezing, rhonchi or rales.  Musculoskeletal:        General: Normal range of motion.     Cervical back: Normal range of motion.     Right lower leg: No edema.     Left lower leg: No edema.  Lymphadenopathy:     Cervical: No cervical adenopathy.  Neurological:     General: No focal deficit present.     Mental Status: She is alert and oriented to person, place, and time.  Psychiatric:        Mood and Affect: Mood normal.        Thought Content: Thought content normal.    No results found for any visits on 04/18/24.      Assessment & Plan:   Assessment and Plan Assessment & Plan Primary amenorrhea Primary amenorrhea at age 28 with hirsutism, possible PCOS. Differential includes PCOS and thyroid dysfunction. - Order fasting insulin and hormone levels to evaluate for PCOS. - Refer to gynecology for further evaluation of amenorrhea. - Order thyroid function tests to assess for thyroid dysfunction.  Recurrent bacterial vaginosis Recurrent vaginal discharge with fishy odor, suggestive of bacterial vaginosis. - Perform vaginal swab for further evaluation of discharge.  Soft tissue mass, neck (possible thyroid nodule) Palpable neck mass, possibly a thyroid nodule. Family history of thyroid issues. - Order thyroid ultrasound to evaluate  the neck mass.  Obesity Obesity with weight fluctuations. Possible contributing factors include PCOS, thyroid dysfunction, and prediabetes. - Order A1c to assess for prediabetes. - Discussed the limitations of BMI as a measure of health.  Migraine History of migraines.  Plantar fasciitis Foot pain localized to heel, consistent with plantar fasciitis. - Recommend wearing compression socks at work. - Advise against going barefoot at home for two weeks. - Suggest wearing supportive footwear, such as Crocs, at home. - Provide exercises for plantar fasciitis. - Recommend shoe inserts for additional support.  Acne Facial acne with  no previous treatment. - Prescribe Benzaclin topical gel for acne treatment. - Advise to use Benzaclin once daily initially, and adjust frequency based on skin tolerance.  Anxiety Chronic anxiety managed with marijuana use. Interest in non-daily medication. - Prescribe buspirone as needed for anxiety, with instructions to crush and mix with food if necessary. - Discussed potential side effect of drowsiness with buspirone.  General Health Maintenance Routine health maintenance discussed, including importance of flu vaccination post-sepsis. - Administer flu shot.     No orders of the defined types were placed in this encounter.    No orders of the defined types were placed in this encounter.   No follow-ups on file.  Jeanne LITTIE Ku, FNP

## 2024-04-18 NOTE — Patient Instructions (Addendum)
 Welcome to [facility]!  Thank you for choosing us  for your Primary Care needs.   We offer in person and video appointments for your convenience. You may call our office to schedule appointments, or you may schedule appointments with me through MyChart.   The best way to get in contact with me is via MyChart message. This will get to me faster than a phone call, unless there is an emergency, then please call 911.  The lab is located downstairs in the Sports Medicine building, we also have xray available there.   We are checking labs today, will be in contact with any results that require further attention  Benza-clin to face after washing, then apply moisturizer   May use this once daily for 2 weeks, or every other day until your skin adjusts to it. Then may use this twice a day.   I have ordered ultrasounds for your thyroid and the lump on your breast. Someone will be reaching out to get you scheduled for these. We will be in contact with you once results are received.  I also sent in referrals to therapist and to gynecology for you.  Someone will also be reaching out to get you scheduled for this.  I have sent in buspirone 10 mg for you to take 1 tablet twice a day as needed for anxiety.  You may crush this and put it in a liquid or ice cream or pudding, which ever works for you.  Follow up with me in about 3 months for labs and medication management, sooner if needed.

## 2024-04-19 DIAGNOSIS — N898 Other specified noninflammatory disorders of vagina: Secondary | ICD-10-CM | POA: Insufficient documentation

## 2024-04-19 DIAGNOSIS — R739 Hyperglycemia, unspecified: Secondary | ICD-10-CM | POA: Insufficient documentation

## 2024-04-19 DIAGNOSIS — E049 Nontoxic goiter, unspecified: Secondary | ICD-10-CM | POA: Insufficient documentation

## 2024-04-19 DIAGNOSIS — L680 Hirsutism: Secondary | ICD-10-CM | POA: Insufficient documentation

## 2024-04-19 DIAGNOSIS — L700 Acne vulgaris: Secondary | ICD-10-CM | POA: Insufficient documentation

## 2024-04-19 DIAGNOSIS — N910 Primary amenorrhea: Secondary | ICD-10-CM | POA: Insufficient documentation

## 2024-04-19 DIAGNOSIS — M722 Plantar fascial fibromatosis: Secondary | ICD-10-CM | POA: Insufficient documentation

## 2024-04-19 DIAGNOSIS — Z23 Encounter for immunization: Secondary | ICD-10-CM | POA: Insufficient documentation

## 2024-04-19 DIAGNOSIS — M7989 Other specified soft tissue disorders: Secondary | ICD-10-CM | POA: Insufficient documentation

## 2024-04-19 LAB — COMPREHENSIVE METABOLIC PANEL WITH GFR
ALT: 18 U/L (ref 0–35)
AST: 16 U/L (ref 0–37)
Albumin: 4.3 g/dL (ref 3.5–5.2)
Alkaline Phosphatase: 115 U/L (ref 39–117)
BUN: 9 mg/dL (ref 6–23)
CO2: 23 meq/L (ref 19–32)
Calcium: 9.2 mg/dL (ref 8.4–10.5)
Chloride: 105 meq/L (ref 96–112)
Creatinine, Ser: 0.68 mg/dL (ref 0.40–1.20)
GFR: 123.88 mL/min (ref >60.00)
Glucose, Bld: 77 mg/dL (ref 70–99)
Potassium: 4.4 meq/L (ref 3.5–5.1)
Sodium: 141 meq/L (ref 135–145)
Total Bilirubin: 0.3 mg/dL (ref 0.2–1.2)
Total Protein: 8 g/dL (ref 6.0–8.3)

## 2024-04-19 LAB — THYROID PANEL
Free Thyroxine Index: 1.4 (ref 1.2–4.9)
T3 Uptake Ratio: 23 % — ABNORMAL LOW (ref 24–39)
T4, Total: 6 ug/dL (ref 4.5–12.0)

## 2024-04-19 LAB — HSV 1 AND 2 AB, IGG
HSV 1 Glycoprotein G Ab, IgG: NONREACTIVE
HSV 2 IgG, Type Spec: REACTIVE — AB

## 2024-04-19 LAB — C-REACTIVE PROTEIN: CRP: <0.5 mg/dL (ref 0.5–20.0)

## 2024-04-20 ENCOUNTER — Other Ambulatory Visit

## 2024-04-21 ENCOUNTER — Ambulatory Visit: Payer: Self-pay | Admitting: Family Medicine

## 2024-04-21 ENCOUNTER — Other Ambulatory Visit: Payer: Self-pay

## 2024-04-21 DIAGNOSIS — B9689 Other specified bacterial agents as the cause of diseases classified elsewhere: Secondary | ICD-10-CM

## 2024-04-21 DIAGNOSIS — N912 Amenorrhea, unspecified: Secondary | ICD-10-CM

## 2024-04-21 DIAGNOSIS — E559 Vitamin D deficiency, unspecified: Secondary | ICD-10-CM

## 2024-04-21 DIAGNOSIS — R7989 Other specified abnormal findings of blood chemistry: Secondary | ICD-10-CM

## 2024-04-21 LAB — CERVICOVAGINAL ANCILLARY ONLY
Bacterial Vaginitis (gardnerella): POSITIVE — AB
Candida Glabrata: NEGATIVE
Candida Vaginitis: NEGATIVE
Chlamydia: NEGATIVE
Comment: NEGATIVE
Comment: NEGATIVE
Comment: NEGATIVE
Comment: NEGATIVE
Comment: NEGATIVE
Comment: NORMAL
Neisseria Gonorrhea: NEGATIVE
Trichomonas: NEGATIVE

## 2024-04-21 MED ORDER — METRONIDAZOLE 500 MG PO TABS
500.0000 mg | ORAL_TABLET | Freq: Two times a day (BID) | ORAL | 0 refills | Status: AC
  Filled 2024-04-21 – 2024-05-16 (×2): qty 14, 7d supply, fill #0

## 2024-04-21 MED ORDER — VITAMIN D (ERGOCALCIFEROL) 1.25 MG (50000 UNIT) PO CAPS
50000.0000 [IU] | ORAL_CAPSULE | ORAL | 0 refills | Status: AC
  Filled 2024-04-21 – 2024-05-16 (×2): qty 8, 56d supply, fill #0

## 2024-04-22 LAB — INSULIN, RANDOM: Insulin: 16.3 u[IU]/mL

## 2024-04-22 LAB — ESTROGENS, TOTAL: Estrogen: 215 pg/mL

## 2024-04-22 LAB — PROGESTERONE: Progesterone: 0.6 ng/mL

## 2024-04-24 DIAGNOSIS — Z419 Encounter for procedure for purposes other than remedying health state, unspecified: Secondary | ICD-10-CM | POA: Diagnosis not present

## 2024-04-25 ENCOUNTER — Ambulatory Visit
Admission: RE | Admit: 2024-04-25 | Discharge: 2024-04-25 | Disposition: A | Source: Ambulatory Visit | Attending: Family Medicine

## 2024-04-25 DIAGNOSIS — E049 Nontoxic goiter, unspecified: Secondary | ICD-10-CM

## 2024-04-27 ENCOUNTER — Other Ambulatory Visit: Payer: Self-pay | Admitting: Family Medicine

## 2024-04-27 DIAGNOSIS — E049 Nontoxic goiter, unspecified: Secondary | ICD-10-CM

## 2024-04-27 DIAGNOSIS — R6510 Systemic inflammatory response syndrome (SIRS) of non-infectious origin without acute organ dysfunction: Secondary | ICD-10-CM

## 2024-04-27 DIAGNOSIS — N898 Other specified noninflammatory disorders of vagina: Secondary | ICD-10-CM

## 2024-04-27 DIAGNOSIS — N63 Unspecified lump in unspecified breast: Secondary | ICD-10-CM

## 2024-04-27 DIAGNOSIS — Z6841 Body Mass Index (BMI) 40.0 and over, adult: Secondary | ICD-10-CM

## 2024-04-27 DIAGNOSIS — F411 Generalized anxiety disorder: Secondary | ICD-10-CM

## 2024-04-27 DIAGNOSIS — N910 Primary amenorrhea: Secondary | ICD-10-CM

## 2024-04-27 DIAGNOSIS — M722 Plantar fascial fibromatosis: Secondary | ICD-10-CM

## 2024-04-27 DIAGNOSIS — R195 Other fecal abnormalities: Secondary | ICD-10-CM

## 2024-04-27 DIAGNOSIS — R739 Hyperglycemia, unspecified: Secondary | ICD-10-CM

## 2024-04-27 DIAGNOSIS — G43809 Other migraine, not intractable, without status migrainosus: Secondary | ICD-10-CM

## 2024-04-27 DIAGNOSIS — L680 Hirsutism: Secondary | ICD-10-CM

## 2024-04-27 DIAGNOSIS — Z23 Encounter for immunization: Secondary | ICD-10-CM

## 2024-04-27 DIAGNOSIS — M7989 Other specified soft tissue disorders: Secondary | ICD-10-CM

## 2024-04-27 DIAGNOSIS — L700 Acne vulgaris: Secondary | ICD-10-CM

## 2024-04-28 ENCOUNTER — Other Ambulatory Visit: Payer: Self-pay

## 2024-05-02 ENCOUNTER — Other Ambulatory Visit: Payer: Self-pay

## 2024-05-06 ENCOUNTER — Other Ambulatory Visit

## 2024-05-16 ENCOUNTER — Other Ambulatory Visit: Payer: Self-pay

## 2024-05-16 ENCOUNTER — Other Ambulatory Visit: Payer: Self-pay | Admitting: Family Medicine

## 2024-05-16 DIAGNOSIS — L700 Acne vulgaris: Secondary | ICD-10-CM

## 2024-05-16 MED ORDER — CLINDAMYCIN PHOS-BENZOYL PEROX 1-5 % EX GEL
1.0000 | Freq: Two times a day (BID) | CUTANEOUS | 0 refills | Status: AC
  Filled 2024-05-16: qty 25, 13d supply, fill #0

## 2024-05-17 ENCOUNTER — Encounter: Payer: Self-pay | Admitting: Pharmacist

## 2024-05-17 ENCOUNTER — Other Ambulatory Visit: Payer: Self-pay

## 2024-05-18 ENCOUNTER — Other Ambulatory Visit: Payer: Self-pay

## 2024-05-20 ENCOUNTER — Other Ambulatory Visit: Payer: Self-pay

## 2024-05-23 ENCOUNTER — Other Ambulatory Visit: Payer: Self-pay

## 2024-06-14 ENCOUNTER — Ambulatory Visit (HOSPITAL_COMMUNITY): Admitting: Licensed Clinical Social Worker

## 2024-07-19 ENCOUNTER — Ambulatory Visit: Admitting: Family Medicine

## 2024-07-19 NOTE — Progress Notes (Deleted)
" ° °  Established Patient Office Visit  Subjective:     Patient ID: Jeanne Harrison, female    DOB: 12-09-2000, 24 y.o.   MRN: [id number]  No chief complaint on file.   HPI  Discussed the use of AI scribe software for clinical note transcription with the patient, who gave verbal consent to proceed.  History of Present Illness      ROS Per HPI      Objective:    There were no vitals taken for this visit.   Physical Exam Vitals and nursing note reviewed.  Constitutional:      General: She is not in acute distress.    Appearance: Normal appearance. She is normal weight.  HENT:     Head: Normocephalic and atraumatic.     Right Ear: External ear normal.     Left Ear: External ear normal.     Nose: Nose normal.     Mouth/Throat:     Mouth: Mucous membranes are moist.     Pharynx: Oropharynx is clear.  Eyes:     Extraocular Movements: Extraocular movements intact.     Pupils: Pupils are equal, round, and reactive to light.  Cardiovascular:     Rate and Rhythm: Normal rate and regular rhythm.     Pulses: Normal pulses.     Heart sounds: Normal heart sounds.  Pulmonary:     Effort: Pulmonary effort is normal. No respiratory distress.     Breath sounds: Normal breath sounds. No wheezing, rhonchi or rales.  Musculoskeletal:        General: Normal range of motion.     Cervical back: Normal range of motion.     Right lower leg: No edema.     Left lower leg: No edema.  Lymphadenopathy:     Cervical: No cervical adenopathy.  Neurological:     General: No focal deficit present.     Mental Status: She is alert and oriented to person, place, and time.  Psychiatric:        Mood and Affect: Mood normal.        Thought Content: Thought content normal.     No results found for any visits on 07/19/24.  The ASCVD Risk score (Arnett DK, et al., 2019) failed to calculate for the following reasons:   The 2019 ASCVD risk score is only valid for ages 43 to 63   * -  Cholesterol units were assumed  {Vitals History (Optional):23777}  {Show previous labs (optional):23779}     Assessment & Plan:   Assessment and Plan Assessment & Plan      No orders of the defined types were placed in this encounter.    No orders of the defined types were placed in this encounter.   No follow-ups on file.  Corean LITTIE Ku, FNP   "

## 2024-08-11 ENCOUNTER — Ambulatory Visit: Payer: Self-pay

## 2024-08-11 NOTE — Telephone Encounter (Signed)
 FYI Only or Action Required?: FYI only for provider: appointment scheduled on 2/2.  Patient was last seen in primary care on 04/18/2024 by Alvia Corean CROME, FNP.  Called Nurse Triage reporting Nausea.  Symptoms began 2 weeks ago.  Interventions attempted: Rest, hydration, or home remedies.  Symptoms are: stable.  Triage Disposition: See PCP When Office is Open (Within 3 Days)  Patient/caregiver understands and will follow disposition?: Yes  Reason for Triage: stomach pain, vomiting . denies other symptom .SABRA current pain rated at 6.. requesting an appt  Reason for Disposition  Nausea lasts > 1 week  Answer Assessment - Initial Assessment Questions Only wanted to see PCP  1. NAUSEA SEVERITY: How bad is the nausea? (e.g., mild, moderate, severe; dehydration, weight loss)     Moderate 2. ONSET: When did the nausea begin?     Few weeks ago 3. VOMITING: Any vomiting? If Yes, ask: How many times today?     Has vomited, none today 4. RECURRENT SYMPTOM: Have you had nausea before? If Yes, ask: When was the last time? What happened that time?     no 5. CAUSE: What do you think is causing the nausea?     unknown 6. PREGNANCY: Is there any chance you are pregnant? (e.g., unprotected intercourse, missed birth control pill, broken condom)     States could be but she doesn't have a cycle  Protocols used: Nausea-A-AH

## 2024-08-15 ENCOUNTER — Ambulatory Visit: Admitting: Family Medicine

## 2024-08-15 ENCOUNTER — Telehealth: Payer: Self-pay

## 2024-08-15 NOTE — Telephone Encounter (Signed)
 Attempted to reach patient in regards to visit for today. Unable to LVM. Office closed due to weather.

## 2024-12-01 ENCOUNTER — Ambulatory Visit: Admitting: "Endocrinology
